# Patient Record
Sex: Female | Born: 1995
Health system: Southern US, Community
[De-identification: ages and names within clinical notes are randomized; demographics above are authoritative.]

## PROBLEM LIST (undated history)

## (undated) DIAGNOSIS — F32A Depression, unspecified: Secondary | ICD-10-CM

## (undated) DIAGNOSIS — F329 Major depressive disorder, single episode, unspecified: Secondary | ICD-10-CM

## (undated) DIAGNOSIS — F913 Oppositional defiant disorder: Secondary | ICD-10-CM

## (undated) DIAGNOSIS — F988 Other specified behavioral and emotional disorders with onset usually occurring in childhood and adolescence: Secondary | ICD-10-CM

## (undated) DIAGNOSIS — F909 Attention-deficit hyperactivity disorder, unspecified type: Secondary | ICD-10-CM

## (undated) DIAGNOSIS — F419 Anxiety disorder, unspecified: Secondary | ICD-10-CM

## (undated) HISTORY — DX: Depression, unspecified: F32.A

## (undated) HISTORY — DX: Attention-deficit hyperactivity disorder, unspecified type: F90.9

## (undated) HISTORY — PX: OTHER SURGICAL HISTORY: SHX169

## (undated) HISTORY — DX: Oppositional defiant disorder: F91.3

## (undated) HISTORY — DX: Major depressive disorder, single episode, unspecified: F32.9

---
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
## 2017-01-09 ENCOUNTER — Ambulatory Visit: Payer: BLUE CROSS/BLUE SHIELD | Admitting: Pediatrics

## 2017-01-10 ENCOUNTER — Telehealth: Payer: Self-pay | Admitting: Pediatrics

## 2017-01-10 ENCOUNTER — Ambulatory Visit: Payer: BLUE CROSS/BLUE SHIELD | Admitting: Family

## 2017-01-10 ENCOUNTER — Encounter: Payer: Self-pay | Admitting: Pediatrics

## 2017-01-10 ENCOUNTER — Encounter: Payer: Self-pay | Admitting: Family

## 2017-01-10 ENCOUNTER — Ambulatory Visit (INDEPENDENT_AMBULATORY_CARE_PROVIDER_SITE_OTHER): Payer: 59 | Admitting: Family

## 2017-01-10 VITALS — BP 126/80 | HR 79 | Temp 98.6°F | Ht 66.0 in | Wt 164.2 lb

## 2017-01-10 DIAGNOSIS — B3731 Acute candidiasis of vulva and vagina: Secondary | ICD-10-CM

## 2017-01-10 DIAGNOSIS — B9689 Other specified bacterial agents as the cause of diseases classified elsewhere: Secondary | ICD-10-CM | POA: Diagnosis not present

## 2017-01-10 DIAGNOSIS — B373 Candidiasis of vulva and vagina: Secondary | ICD-10-CM

## 2017-01-10 DIAGNOSIS — R399 Unspecified symptoms and signs involving the genitourinary system: Secondary | ICD-10-CM

## 2017-01-10 DIAGNOSIS — N760 Acute vaginitis: Secondary | ICD-10-CM

## 2017-01-10 DIAGNOSIS — N898 Other specified noninflammatory disorders of vagina: Secondary | ICD-10-CM | POA: Diagnosis not present

## 2017-01-10 LAB — URINALYSIS, COMPLETE
Bilirubin, UA: NEGATIVE
Glucose, UA: NEGATIVE
Ketones, UA: NEGATIVE
Leukocytes, UA: NEGATIVE
NITRITE UA: NEGATIVE
PH UA: 5.5 (ref 5.0–7.5)
Protein, UA: NEGATIVE
RBC, UA: NEGATIVE
Specific Gravity, UA: 1.02 (ref 1.005–1.030)
UUROB: 0.2 mg/dL (ref 0.2–1.0)

## 2017-01-10 LAB — WET PREP FOR TRICH, YEAST, CLUE
CLUE CELL EXAM: POSITIVE — AB
Trichomonas Exam: NEGATIVE
YEAST EXAM: POSITIVE — AB

## 2017-01-10 LAB — MICROSCOPIC EXAMINATION
Epithelial Cells (non renal): >10 /hpf — ABNORMAL HIGH (ref 0–10)
RBC MICROSCOPIC, UA: NONE SEEN /HPF (ref 0–?)

## 2017-01-10 MED ORDER — FLUCONAZOLE 150 MG PO TABS: 150.0000 mg | ORAL_TABLET | ORAL | 0 refills | Status: DC | PRN

## 2017-01-10 MED ORDER — METRONIDAZOLE 500 MG PO TABS: 500.0000 mg | ORAL_TABLET | Freq: Two times a day (BID) | ORAL | 0 refills | Status: DC

## 2017-01-10 NOTE — Progress Notes (Signed)
   Subjective:    Patient ID: Megan Nielsen, female    DOB: 08/16/1996, 21 y.o.   MRN: [id number]  Vaginal Discharge  The patient's primary symptoms include vaginal discharge. The patient's pertinent negatives include no genital itching or genital odor. This is a new problem. The current episode started in the past 7 days. The problem occurs intermittently. The problem has been unchanged. Associated symptoms include discolored urine, dysuria, frequency and urgency. Pertinent negatives include no flank pain or painful intercourse. The vaginal discharge was white and yellow. There has been no bleeding. She is sexually active. Her menstrual history has been irregular (pt has nexplanon).      Review of Systems  Genitourinary: Positive for dysuria, frequency, urgency and vaginal discharge. Negative for flank pain.  All other systems reviewed and are negative.      Objective:   Physical Exam  Constitutional: She is oriented to person, place, and time. She appears well-developed and well-nourished. No distress.  HENT:  Head: Normocephalic.  Eyes: Pupils are equal, round, and reactive to light.  Neck: Normal range of motion. Neck supple. No thyromegaly present.  Cardiovascular: Normal rate, regular rhythm, normal heart sounds and intact distal pulses.   No murmur heard. Pulmonary/Chest: Effort normal and breath sounds normal. No respiratory distress. She has no wheezes.  Abdominal: Soft. Bowel sounds are normal. She exhibits no distension. There is tenderness (mild lower abd).  Musculoskeletal: Normal range of motion. She exhibits no edema or tenderness.  Neurological: She is alert and oriented to person, place, and time.  Skin: Skin is warm and dry.  Psychiatric: She has a normal mood and affect. Her behavior is normal. Judgment and thought content normal.  Vitals reviewed.     BP 126/80   Pulse 79   Temp 98.6 F (37 C) (Oral)   Ht 5\' 6"  (1.676 m)   Wt 164 lb 3.2 oz (74.5 kg)   BMI  26.50 kg/m      Assessment & Plan:  1. Vaginal discharge - WET PREP FOR [place], YEAST, CLUE  2. UTI symptoms - Urinalysis, Complete  3. BV (bacterial vaginosis) -Keep clean and dry Start probiotic  - metroNIDAZOLE (FLAGYL) 500 MG tablet; Take 1 tablet (500 mg total) by mouth 2 (two) times daily.  Dispense: 14 tablet; Refill: 0  4. Vagina, candidiasis Keep clean and dry Cotton  Underwear Do not scratch - fluconazole (DIFLUCAN) 150 MG tablet; Take 1 tablet (150 mg total) by mouth every three (3) days as needed.  Dispense: 3 tablet; Refill: 0   Evelina Dun, FNP

## 2017-01-10 NOTE — Patient Instructions (Signed)
Bacterial Vaginosis Bacterial vaginosis is a vaginal infection that occurs when the normal balance of bacteria in the vagina is disrupted. It results from an overgrowth of certain bacteria. This is the most common vaginal infection among women ages 15-44. Because bacterial vaginosis increases your risk for STIs (sexually transmitted infections), getting treated can help reduce your risk for chlamydia, gonorrhea, herpes, and HIV (human immunodeficiency virus). Treatment is also important for preventing complications in pregnant women, because this condition can cause an early (premature) delivery. What are the causes? This condition is caused by an increase in harmful bacteria that are normally present in small amounts in the vagina. However, the reason that the condition develops is not fully understood. What increases the risk? The following factors may make you more likely to develop this condition:  Having a new sexual partner or multiple sexual partners.  Having unprotected sex.  Douching.  Having an intrauterine device (IUD).  Smoking.  Drug and alcohol abuse.  Taking certain antibiotic medicines.  Being pregnant.  You cannot get bacterial vaginosis from toilet seats, bedding, swimming pools, or contact with objects around you. What are the signs or symptoms? Symptoms of this condition include:  Grey or white vaginal discharge. The discharge can also be watery or foamy.  A fish-like odor with discharge, especially after sexual intercourse or during menstruation.  Itching in and around the vagina.  Burning or pain with urination.  Some women with bacterial vaginosis have no signs or symptoms. How is this diagnosed? This condition is diagnosed based on:  Your medical history.  A physical exam of the vagina.  Testing a sample of vaginal fluid under a microscope to look for a large amount of bad bacteria or abnormal cells. Your health care provider may use a cotton swab  or a small wooden spatula to collect the sample.  How is this treated? This condition is treated with antibiotics. These may be given as a pill, a vaginal cream, or a medicine that is put into the vagina (suppository). If the condition comes back after treatment, a second round of antibiotics may be needed. Follow these instructions at home: Medicines  Take over-the-counter and prescription medicines only as told by your health care provider.  Take or use your antibiotic as told by your health care provider. Do not stop taking or using the antibiotic even if you start to feel better. General instructions  If you have a female sexual partner, tell her that you have a vaginal infection. She should see her health care provider and be treated if she has symptoms. If you have a female sexual partner, he does not need treatment.  During treatment: ? Avoid sexual activity until you finish treatment. ? Do not douche. ? Avoid alcohol as directed by your health care provider. ? Avoid breastfeeding as directed by your health care provider.  Drink enough water and fluids to keep your urine clear or pale yellow.  Keep the area around your vagina and rectum clean. ? Wash the area daily with warm water. ? Wipe yourself from front to back after using the toilet.  Keep all follow-up visits as told by your health care provider. This is important. How is this prevented?  Do not douche.  Wash the outside of your vagina with warm water only.  Use protection when having sex. This includes latex condoms and dental dams.  Limit how many sexual partners you have. To help prevent bacterial vaginosis, it is best to have sex with just   one partner (monogamous).  Make sure you and your sexual partner are tested for STIs.  Wear cotton or cotton-lined underwear.  Avoid wearing tight pants and pantyhose, especially during summer.  Limit the amount of alcohol that you drink.  Do not use any products that  contain nicotine or tobacco, such as cigarettes and e-cigarettes. If you need help quitting, ask your health care provider.  Do not use illegal drugs. Where to find more information:  Centers for Disease Control and Prevention: www.cdc.gov/std  American Sexual Health Association (ASHA): www.ashastd.org  U.S. Department of Health and Human Services, Office on Women's Health: www.womenshealth.gov/ or https://www.womenshealth.gov/a-z-topics/bacterial-vaginosis Contact a health care provider if:  Your symptoms do not improve, even after treatment.  You have more discharge or pain when urinating.  You have a fever.  You have pain in your abdomen.  You have pain during sex.  You have vaginal bleeding between periods. Summary  Bacterial vaginosis is a vaginal infection that occurs when the normal balance of bacteria in the vagina is disrupted.  Because bacterial vaginosis increases your risk for STIs (sexually transmitted infections), getting treated can help reduce your risk for chlamydia, gonorrhea, herpes, and HIV (human immunodeficiency virus). Treatment is also important for preventing complications in pregnant women, because the condition can cause an early (premature) delivery.  This condition is treated with antibiotic medicines. These may be given as a pill, a vaginal cream, or a medicine that is put into the vagina (suppository). This information is not intended to replace advice given to you by your health care provider. Make sure you discuss any questions you have with your health care provider. Document Released: 08/15/2005 Document Revised: 04/30/2016 Document Reviewed: 04/30/2016 Elsevier Interactive Patient Education  2017 Elsevier Inc.  

## 2017-01-13 ENCOUNTER — Telehealth: Payer: Self-pay | Admitting: Pediatrics

## 2017-01-13 ENCOUNTER — Other Ambulatory Visit: Payer: Self-pay | Admitting: Family Medicine

## 2017-01-13 MED ORDER — METRONIDAZOLE 0.75 % VA GEL: 1.0000 | Freq: Every day | VAGINAL | 0 refills | Status: AC

## 2017-01-13 NOTE — Telephone Encounter (Signed)
Pt aware.

## 2017-01-13 NOTE — Telephone Encounter (Signed)
Please contact the patient Metrogel vaginal sent to pharmacy to take instead of pills

## 2017-01-13 NOTE — Telephone Encounter (Signed)
Patient called stating that she is having a hard time swallowing medication and would like to know if anything else can be sent in to pharmacy

## 2017-02-15 ENCOUNTER — Ambulatory Visit (INDEPENDENT_AMBULATORY_CARE_PROVIDER_SITE_OTHER): Payer: 59 | Admitting: Family Medicine

## 2017-02-15 ENCOUNTER — Encounter: Payer: Self-pay | Admitting: Family Medicine

## 2017-02-15 VITALS — BP 121/85 | HR 97 | Temp 97.7°F | Ht 66.0 in | Wt 162.0 lb

## 2017-02-15 DIAGNOSIS — S80261A Insect bite (nonvenomous), right knee, initial encounter: Secondary | ICD-10-CM

## 2017-02-15 DIAGNOSIS — W57XXXA Bitten or stung by nonvenomous insect and other nonvenomous arthropods, initial encounter: Secondary | ICD-10-CM | POA: Diagnosis not present

## 2017-02-15 MED ORDER — TRIAMCINOLONE ACETONIDE 0.1 % EX CREA: 1.0000 "application " | TOPICAL_CREAM | Freq: Three times a day (TID) | CUTANEOUS | 0 refills | Status: DC

## 2017-02-15 NOTE — Progress Notes (Signed)
Chief Complaint  Patient presents with  . Insect Bite    pt here today with multiple red areas on legs that appear to be some kind of insect bite.    HPI  Patient presents today for Onset this a.m. when she awoke of red swollen bites scattered over her arms and legs. Primarily at the right forearm and thigh as well as the right knee and then there are a couple down near the ankle of both legs. They weren't there when she went to bed. She woke up and they were there. They're itching and burning. Not getting bigger. She has no fever chills or sweats. She does note that the house where she slept has had bed bugs in the past. However another girl who slept in the sling same bed with her does not have bedbug bites today.  PMH: Smoking status noted ROS: Per HPI  Objective: BP 121/85   Pulse 97   Temp 97.7 F (36.5 C) (Oral)   Ht 5\' 6"  (1.676 m)   Wt 162 lb (73.5 kg)   BMI 26.15 kg/m  Gen: NAD, alert, cooperative with exam HEENT: NCAT, EOMI, PERRL Ext: No edema, warm. There are multiple blanching urticarial wheals measuring from 6 mm to 1.5 cm scattered over the limbs. There are about 6 in a row spaced out over the right forearm. One at the upper right thigh 3 scattered over the anterior surface of the right knee. One each posterolateral from the medial malleolus bilaterally.  Neuro: Alert and oriented, No gross deficits  Assessment and plan:  1. Bedbug bite, initial encounter     Meds ordered this encounter  Medications  . triamcinolone cream (KENALOG) 0.1 %    Sig: Apply 1 application topically 3 (three) times daily. Avoid face and genitalia    Dispense:  45 g    Refill:  0    Monitor for signs of infection. Should redness swelling pain or purulent drainage occur follow-up immediately.  Follow up as needed.  Claretta Fraise, MD

## 2017-02-22 ENCOUNTER — Encounter: Payer: Self-pay | Admitting: Pediatrics

## 2017-02-22 ENCOUNTER — Ambulatory Visit (INDEPENDENT_AMBULATORY_CARE_PROVIDER_SITE_OTHER): Payer: 59 | Admitting: Pediatrics

## 2017-02-22 VITALS — BP 114/78 | HR 69 | Temp 97.1°F | Ht 66.0 in | Wt 161.8 lb

## 2017-02-22 DIAGNOSIS — S80861D Insect bite (nonvenomous), right lower leg, subsequent encounter: Secondary | ICD-10-CM | POA: Diagnosis not present

## 2017-02-22 DIAGNOSIS — W57XXXD Bitten or stung by nonvenomous insect and other nonvenomous arthropods, subsequent encounter: Secondary | ICD-10-CM | POA: Diagnosis not present

## 2017-02-22 MED ORDER — TRIAMCINOLONE ACETONIDE 0.1 % EX CREA: 1.0000 "application " | TOPICAL_CREAM | Freq: Two times a day (BID) | CUTANEOUS | 0 refills | Status: DC

## 2017-02-22 NOTE — Progress Notes (Signed)
  Subjective:   Patient ID: Megan Nielsen, female    DOB: 1996/01/04, 21 y.o.   MRN: [id number] CC: Bed Bugs  HPI: Megan Nielsen is a 21 y.o. female presenting for Bed Bugs  Seen last week for red welts on R leg, diagnosed as bed bug bites, treated with topical steroid  Leg spots improved, never itched or hurt Feeling well otherwise Woke up this morning with similar red bumps/bug bites L upper arm and neck Works in Forensic psychologist at [employer] Has been outside Washed all her sheets last visit No other family members/friends with similar spots Thinks the topical steroid did help The rash doesn't come and go, stays but fades over several days  Relevant past medical, surgical, family and social history reviewed. Allergies and medications reviewed and updated. History  Smoking Status  . Passive Smoke Exposure - Never Smoker  Smokeless Tobacco  . Never Used   ROS: Per HPI   Objective:    BP 114/78   Pulse 69   Temp 97.1 F (36.2 C) (Oral)   Ht 5\' 6"  (1.676 m)   Wt 161 lb 12.8 oz (73.4 kg)   BMI 26.12 kg/m   Wt Readings from Last 3 Encounters:  02/22/17 161 lb 12.8 oz (73.4 kg) (89 %, Z= 1.22)*  02/15/17 162 lb (73.5 kg) (89 %, Z= 1.23)*  01/10/17 164 lb 3.2 oz (74.5 kg) (90 %, Z= 1.29)*   * Growth percentiles are based on CDC 2-20 Years data.    Gen: NAD, alert, cooperative with exam, NCAT EYES: EOMI, no conjunctival injection, or no icterus ENT:   OP without erythema LYMPH: no cervical LAD CV: NRRR, normal S1/S2, no murmur, distal pulses 2+ b/l Resp: CTABL, no wheezes, normal WOB Neuro: Alert and oriented Skin: L upper arm with about 10 1cm or less red raised urticaria Several over R shoulder/upper chest as well Several fading similar spots with central scab R lower leg  Assessment & Plan:  Charon was seen today for bed bugs.  Diagnoses and all orders for this visit:  Bug bite, subsequent encounter Triamcinolone as needed Can try benadryl if needed for itching Return  precautions discussed -     triamcinolone cream (KENALOG) 0.1 %; Apply 1 application topically 2 (two) times daily. Avoid face and genitalia   Follow up plan: Return if symptoms worsen or fail to improve. Assunta Found, MD [place]

## 2017-05-09 ENCOUNTER — Encounter: Payer: Self-pay | Admitting: Nurse Practitioner

## 2017-05-09 ENCOUNTER — Ambulatory Visit (INDEPENDENT_AMBULATORY_CARE_PROVIDER_SITE_OTHER): Payer: 59 | Admitting: Nurse Practitioner

## 2017-05-09 VITALS — BP 114/83 | HR 72 | Temp 97.6°F | Ht 66.0 in | Wt 167.0 lb

## 2017-05-09 DIAGNOSIS — L709 Acne, unspecified: Secondary | ICD-10-CM

## 2017-05-09 MED ORDER — CLINDAMYCIN PHOSPHATE 1 % EX SWAB: 1.0000 "application " | Freq: Two times a day (BID) | CUTANEOUS | 2 refills | Status: DC

## 2017-05-09 NOTE — Patient Instructions (Signed)
Acne  Acne is a skin problem that causes small, red bumps (pimples). Acne happens when the tiny holes in your skin (pores) get blocked. Your pores may become red, sore, and swollen. They may also become infected. Acne is a common skin problem. It is especially common in teenagers. Acne usually goes away over time.  Follow these instructions at home:  Good skin care is the most important thing you can do to treat your acne. Take care of your skin as told by your doctor. You may be told to do these things:  · Wash your skin gently at least two times each day. You should also wash your skin:  ? After you exercise.  ? Before you go to bed.  · Use mild soap.  · Use a water-based skin moisturizer after you wash your skin.  · Use a sunscreen or sunblock with SPF 30 or greater. This is very important if you are using acne medicines.  · Choose cosmetics that will not plug your oil glands (are noncomedogenic).    Medicines  · Take over-the-counter and prescription medicines only as told by your doctor.  · If you were prescribed an antibiotic medicine, apply or take it as told by your doctor. Do not stop using the antibiotic even if your acne improves.  General instructions  · Keep your hair clean and off of your face. Shampoo your hair regularly. If you have oily hair, you may need to wash it every day.  · Avoid leaning your chin or forehead on your hands.  · Avoid wearing tight headbands or hats.  · Avoid picking or squeezing your pimples. That can make your acne worse and cause scarring.  · Keep all follow-up visits as told by your doctor. This is important.  · Shave gently. Only shave when it is necessary.  · Keep a food journal. This can help you to see if any foods are linked with your acne.  Contact a doctor if:  · Your acne is not better after eight weeks.  · Your acne gets worse.  · You have a large area of skin that is red or tender.  · You think that you are having side effects from any acne medicine.  This  information is not intended to replace advice given to you by your health care provider. Make sure you discuss any questions you have with your health care provider.  Document Released: 08/04/2011 Document Revised: 01/21/2016 Document Reviewed: 10/22/2014  Elsevier Interactive Patient Education © 2018 Elsevier Inc.

## 2017-05-09 NOTE — Progress Notes (Signed)
   Subjective:    Patient ID: Megan Nielsen, female    DOB: March 20, 1996, 21 y.o.   MRN: [id number]  HPI Patient in the office with complaints of a rash to the left side of her face that has been present for about 2-3 months.  She states usually this rash will go away within a week, but for the last 2-3 months it has been present.  Patient states it itches some, usually after getting out of the shower.  She has tried over-the-counter cleansers and moisturizers and nothing seems to help.  She notices mostly red bumps on her face with an occasional pustule forming.   Review of Systems  Constitutional: Negative for activity change, appetite change and fatigue.  Skin: Positive for rash (left lower cheek).  Neurological: Negative for dizziness, light-headedness and headaches.  All other systems reviewed and are negative.      Objective:   Physical Exam  Constitutional: She appears well-developed and well-nourished. No distress.  HENT:  Head: Normocephalic.  Eyes: Pupils are equal, round, and reactive to light.  Cardiovascular: Normal rate and regular rhythm.   Pulmonary/Chest: Effort normal and breath sounds normal.  Skin: Skin is warm and dry.  Erythematous maculopapular lesion on left cheek that extend down across chin- some slightly bluish discoloration on some lesions.  Psychiatric: She has a normal mood and affect. Her behavior is normal. Judgment and thought content normal.   BP 114/83   Pulse 72   Temp 97.6 F (36.4 C) (Oral)   Ht 5\' 6"  (1.676 m)   Wt 167 lb (75.8 kg)   BMI 26.95 kg/m     Assessment & Plan:  1. Acne, unspecified acne type Remove make up nightly - clindamycin (CLEOCIN T) 1 % SWAB; Apply 1 application topically 2 (two) times daily.  Dispense: 60 Package; Refill: 2 - Ambulatory referral to Dermatology  [place], [place]

## 2017-05-18 DIAGNOSIS — L709 Acne, unspecified: Secondary | ICD-10-CM | POA: Diagnosis not present

## 2017-05-18 DIAGNOSIS — D229 Melanocytic nevi, unspecified: Secondary | ICD-10-CM | POA: Diagnosis not present

## 2017-06-28 DIAGNOSIS — T50904A Poisoning by unspecified drugs, medicaments and biological substances, undetermined, initial encounter: Secondary | ICD-10-CM | POA: Diagnosis not present

## 2017-06-28 DIAGNOSIS — R51 Headache: Secondary | ICD-10-CM | POA: Diagnosis not present

## 2017-08-18 ENCOUNTER — Ambulatory Visit: Payer: 59 | Admitting: Family Medicine

## 2017-08-18 ENCOUNTER — Encounter: Payer: Self-pay | Admitting: Family Medicine

## 2017-08-18 VITALS — BP 127/82 | HR 90 | Temp 97.5°F | Ht 66.01 in | Wt 161.0 lb

## 2017-08-18 DIAGNOSIS — J019 Acute sinusitis, unspecified: Secondary | ICD-10-CM | POA: Diagnosis not present

## 2017-08-18 MED ORDER — FLUTICASONE PROPIONATE 50 MCG/ACT NA SUSP: 1.0000 | Freq: Two times a day (BID) | NASAL | 6 refills | Status: DC | PRN

## 2017-08-18 NOTE — Progress Notes (Signed)
BP 127/82   Pulse 90   Temp (!) 97.5 F (36.4 C) (Oral)   Ht 5' 6.01" (1.677 m)   Wt 161 lb (73 kg)   BMI 25.98 kg/m    Subjective:    Patient ID: Megan Nielsen, female    DOB: Feb 27, 1996, 20 y.o.   MRN: [id number]  HPI: Megan Nielsen is a 21 y.o. female presenting on 08/18/2017 for Cough and Nasal Congestion   HPI Cough and nasal congestion and sinus congestion Patient comes in complaining of cough and nasal congestion and sinus congestion that is been going on for the past 3 days.  She has been using Mucinex for allergy pill but did not seem to improve it.  She says she does get a lot of drainage and sinus pressure just under her eyes and leading to a cough and sore throat.  She denies any fevers or chills or shortness of breath or wheezing.  Says her boyfriend has a similar illness but he got it after her.  Relevant past medical, surgical, family and social history reviewed and updated as indicated. Interim medical history since our last visit reviewed. Allergies and medications reviewed and updated.  Review of Systems  Constitutional: Negative for chills and fever.  HENT: Positive for congestion, postnasal drip, rhinorrhea, sinus pressure and sore throat. Negative for ear discharge, ear pain and sneezing.   Eyes: Negative for visual disturbance.  Respiratory: Positive for cough. Negative for chest tightness and shortness of breath.   Cardiovascular: Negative for chest pain and leg swelling.  Musculoskeletal: Negative for back pain and gait problem.  Skin: Negative for rash.  Neurological: Negative for light-headedness and headaches.  Psychiatric/Behavioral: Negative for agitation and behavioral problems.  All other systems reviewed and are negative.   Per HPI unless specifically indicated above        Objective:    BP 127/82   Pulse 90   Temp (!) 97.5 F (36.4 C) (Oral)   Ht 5' 6.01" (1.677 m)   Wt 161 lb (73 kg)   BMI 25.98 kg/m   Wt Readings from Last 3  Encounters:  08/18/17 161 lb (73 kg) (88 %, Z= 1.17)*  05/09/17 167 lb (75.8 kg) (91 %, Z= 1.33)*  02/22/17 161 lb 12.8 oz (73.4 kg) (89 %, Z= 1.22)*   * Growth percentiles are based on CDC (Girls, 2-20 Years) data.    Physical Exam  Constitutional: She is oriented to person, place, and time. She appears well-developed and well-nourished. No distress.  HENT:  Right Ear: Tympanic membrane, external ear and ear canal normal.  Left Ear: Tympanic membrane, external ear and ear canal normal.  Nose: Mucosal edema and rhinorrhea present. No epistaxis. Right sinus exhibits maxillary sinus tenderness. Right sinus exhibits no frontal sinus tenderness. Left sinus exhibits maxillary sinus tenderness. Left sinus exhibits no frontal sinus tenderness.  Mouth/Throat: Uvula is midline and mucous membranes are normal. Posterior oropharyngeal edema and posterior oropharyngeal erythema present. No oropharyngeal exudate or tonsillar abscesses.  Eyes: Conjunctivae are normal.  Cardiovascular: Normal rate, regular rhythm, normal heart sounds and intact distal pulses.  No murmur heard. Pulmonary/Chest: Effort normal and breath sounds normal. No respiratory distress. She has no wheezes. She has no rales.  Musculoskeletal: Normal range of motion. She exhibits no edema or tenderness.  Neurological: She is alert and oriented to person, place, and time. Coordination normal.  Skin: Skin is warm and dry. No rash noted. She is not diaphoretic.  Psychiatric: She has  a normal mood and affect. Her behavior is normal.  Nursing note and vitals reviewed.       Assessment & Plan:   Problem List Items Addressed This Visit    None    Visit Diagnoses    Acute rhinosinusitis    -  Primary   Relevant Medications   fluticasone (FLONASE) 50 MCG/ACT nasal spray    Try Flonase Mucinex and an allergy pill and to call back if not improved in 5 days  Follow up plan: Return if symptoms worsen or fail to improve.  Counseling  provided for all of the vaccine components No orders of the defined types were placed in this encounter.   Caryl Pina, MD [facility] 08/18/2017, 5:32 PM

## 2017-09-06 ENCOUNTER — Ambulatory Visit: Payer: 59 | Admitting: Family Medicine

## 2017-09-06 NOTE — Progress Notes (Deleted)
Subjective: CC: vaginal discharge/ spotting PCP: Eustaquio Maize, MD Megan Nielsen is a 22 y.o. female presenting to clinic today for:  1. Vaginal Discharge  Patient reports that discharge started ***.  She notes that discharge appears ***.  She *** vaginal odors.  She denies vaginal pruritis, abnormal vaginal bleeding, dysuria, hematuria, pelvic pain, nausea, vomiting, fevers, dyspareunia, post coital bleeding, sore throat, rashes.   She has used *** with *** relief.   *** history of ***.  She *** sexually active and *** use condoms.  Last unprotected intercourse ***.  Contraception: ***.  No LMP recorded. Patient has had an implant.     ROS: Per HPI  No Known Allergies Past Medical History:  Diagnosis Date  . ADD (attention deficit disorder)   . ADHD (attention deficit hyperactivity disorder)   . Anxiety   . Depression   . Oppositional defiant disorder     Current Outpatient Medications:  .  clindamycin (CLEOCIN T) 1 % SWAB, Apply 1 application topically 2 (two) times daily., Disp: 60 Package, Rfl: 2 .  Etonogestrel (NEXPLANON [place]), Inject into the skin., Disp: , Rfl:  .  fluticasone (FLONASE) 50 MCG/ACT nasal spray, Place 1 spray into both nostrils 2 (two) times daily as needed for allergies or rhinitis., Disp: 16 g, Rfl: 6 .  triamcinolone cream (KENALOG) 0.1 %, Apply 1 application topically 2 (two) times daily. Avoid face and genitalia, Disp: 45 g, Rfl: 0 Social History   Socioeconomic History  . Marital status: Single    Spouse name: Not on file  . Number of children: Not on file  . Years of education: Not on file  . Highest education level: Not on file  Social Needs  . Financial resource strain: Not on file  . Food insecurity - worry: Not on file  . Food insecurity - inability: Not on file  . Transportation needs - medical: Not on file  . Transportation needs - non-medical: Not on file  Occupational History  . Occupation: Ship broker    Comment: [employer] 9th  grade [employer]  Tobacco Use  . Smoking status: Passive Smoke Exposure - Never Smoker  . Smokeless tobacco: Never Used  Substance and Sexual Activity  . Alcohol use: No  . Drug use: No  . Sexual activity: Yes    Partners: Male    Birth control/protection: None  Other Topics Concern  . Not on file  Social History Narrative  . Not on file   Family History  Problem Relation Age of Onset  . Alcohol abuse Other   . Drug abuse Other   . Alcohol abuse Father   . Alcohol abuse Paternal Uncle   . Drug abuse Maternal Uncle   . Anxiety disorder Mother   . Hypertension Mother   . Hypertension Maternal Grandmother   . Hypertension Maternal Grandfather   . ADD / ADHD Neg Hx   . Bipolar disorder Neg Hx   . Dementia Neg Hx   . Depression Neg Hx   . OCD Neg Hx   . Paranoid behavior Neg Hx   . Schizophrenia Neg Hx   . Seizures Neg Hx   . Physical abuse Neg Hx   . Sexual abuse Neg Hx     Objective: Office vital signs reviewed. There were no vitals taken for this visit.  Physical Examination:  General: Awake, alert, *** nourished, No acute distress HEENT: Normal    Neck: No masses palpated. No lymphadenopathy    Ears: Tympanic  membranes intact, normal light reflex, no erythema, no bulging    Eyes: PERRLA, extraocular membranes intact, sclera ***    Nose: nasal turbinates moist, *** nasal discharge    Throat: moist mucus membranes, no erythema, *** tonsillar exudate.  Airway is patent Cardio: regular rate and rhythm, S1S2 heard, no murmurs appreciated Pulm: clear to auscultation bilaterally, no wheezes, rhonchi or rales; normal work of breathing on room air GI: soft, non-tender, non-distended, bowel sounds present x4, no hepatomegaly, no splenomegaly, no masses GU: external vaginal tissue ***, cervix ***, *** punctate lesions on cervix appreciated, *** discharge from cervical os, *** bleeding, *** cervical motion tenderness, *** abdominal/ adnexal masses Extremities: warm, well  perfused, No edema, cyanosis or clubbing; +*** pulses bilaterally MSK: *** gait and *** station Skin: dry; intact; no rashes or lesions Neuro: *** Strength and light touch sensation grossly intact, *** DTRs ***/4  Assessment/ Plan: 22 y.o. female   ***  No orders of the defined types were placed in this encounter.  No orders of the defined types were placed in this encounter.    Janora Norlander, DO [place] [phone]

## 2017-09-07 ENCOUNTER — Ambulatory Visit: Payer: 59 | Admitting: Family Medicine

## 2017-09-07 ENCOUNTER — Encounter: Payer: Self-pay | Admitting: Family Medicine

## 2017-09-07 VITALS — BP 130/85 | HR 81 | Temp 97.7°F | Ht 66.0 in | Wt 165.0 lb

## 2017-09-07 DIAGNOSIS — N939 Abnormal uterine and vaginal bleeding, unspecified: Secondary | ICD-10-CM | POA: Diagnosis not present

## 2017-09-07 DIAGNOSIS — N898 Other specified noninflammatory disorders of vagina: Secondary | ICD-10-CM

## 2017-09-07 DIAGNOSIS — Z114 Encounter for screening for human immunodeficiency virus [HIV]: Secondary | ICD-10-CM | POA: Diagnosis not present

## 2017-09-07 LAB — WET PREP FOR TRICH, YEAST, CLUE
Clue Cell Exam: NEGATIVE
TRICHOMONAS EXAM: NEGATIVE
Yeast Exam: NEGATIVE

## 2017-09-07 NOTE — Patient Instructions (Signed)
You had labs performed today.  You will be contacted with the results of the labs once they are available, usually in the next 3 days for routine lab work.

## 2017-09-07 NOTE — Progress Notes (Signed)
Subjective: CC: Abnormal vaginal discharge/bleeding PCP: Eustaquio Maize, MD Megan Nielsen is a 22 y.o. female presenting to clinic today for:  1. Vaginal Discharge  Patient reports that vaginal spotting started 4 days ago.  She reports that 2 days ago she noticed a chunky, bloody discharge.  She notes that this is never happened to her.  She denies vaginal odors.  She denies vaginal pruritis, dysuria, hematuria, pelvic pain, nausea, vomiting, fevers, dyspareunia, post coital bleeding, sore throat, rashes.   She has used nothing for symptoms.   Denies history of STDs.  She is sexually active and does not use condoms.  Last unprotected intercourse 2 weeks ago.  Contraception: Nexplanon, which was placed June 02, 2016.  No LMP recorded. Patient has had an implant.     ROS: Per HPI  No Known Allergies Past Medical History:  Diagnosis Date  . ADD (attention deficit disorder)   . ADHD (attention deficit hyperactivity disorder)   . Anxiety   . Depression   . Oppositional defiant disorder     Current Outpatient Medications:  .  clindamycin (CLEOCIN T) 1 % SWAB, Apply 1 application topically 2 (two) times daily., Disp: 60 Package, Rfl: 2 .  Etonogestrel (NEXPLANON [place]), Inject into the skin., Disp: , Rfl:  .  fluticasone (FLONASE) 50 MCG/ACT nasal spray, Place 1 spray into both nostrils 2 (two) times daily as needed for allergies or rhinitis., Disp: 16 g, Rfl: 6 .  triamcinolone cream (KENALOG) 0.1 %, Apply 1 application topically 2 (two) times daily. Avoid face and genitalia, Disp: 45 g, Rfl: 0 Social History   Socioeconomic History  . Marital status: Single    Spouse name: Not on file  . Number of children: Not on file  . Years of education: Not on file  . Highest education level: Not on file  Social Needs  . Financial resource strain: Not on file  . Food insecurity - worry: Not on file  . Food insecurity - inability: Not on file  . Transportation needs - medical: Not on  file  . Transportation needs - non-medical: Not on file  Occupational History  . Occupation: Ship broker    Comment: [employer] 9th grade [employer]  Tobacco Use  . Smoking status: Passive Smoke Exposure - Never Smoker  . Smokeless tobacco: Never Used  Substance and Sexual Activity  . Alcohol use: No  . Drug use: No  . Sexual activity: Yes    Partners: Male    Birth control/protection: None  Other Topics Concern  . Not on file  Social History Narrative  . Not on file   Family History  Problem Relation Age of Onset  . Alcohol abuse Other   . Drug abuse Other   . Alcohol abuse Father   . Alcohol abuse Paternal Uncle   . Drug abuse Maternal Uncle   . Anxiety disorder Mother   . Hypertension Mother   . Hypertension Maternal Grandmother   . Hypertension Maternal Grandfather   . ADD / ADHD Neg Hx   . Bipolar disorder Neg Hx   . Dementia Neg Hx   . Depression Neg Hx   . OCD Neg Hx   . Paranoid behavior Neg Hx   . Schizophrenia Neg Hx   . Seizures Neg Hx   . Physical abuse Neg Hx   . Sexual abuse Neg Hx     Objective: Office vital signs reviewed. BP 130/85   Pulse 81   Temp 97.7 F (36.5  C) (Oral)   Ht 5\' 6"  (1.676 m)   Wt 165 lb (74.8 kg)   BMI 26.63 kg/m   Physical Examination:  General: Awake, alert, well nourished, No acute distress GU: external vaginal tissue normal, cervix midline, no punctate lesions on cervix appreciated, moderate mucus discharge from cervical os, scant bleeding, no cervical motion tenderness, no abdominal/ adnexal masses   Assessment/ Plan: 22 y.o. female   1. Abnormal vaginal bleeding May be related to Nexplanon.  Will rule out ectopic pregnancy/unplanned pregnancy with serum hCG.  Will also check for STD. - hCG, serum, qualitative  2. Vaginal discharge Again, related to abnormal vaginal bleeding.  Will check for STDs. - WET PREP FOR TRICH, YEAST, CLUE - GC/Chlamydia Probe Amp  3. Screening for HIV without presence of risk factors -  HIV antibody   Orders Placed This Encounter  Procedures  . WET PREP FOR [place], YEAST, CLUE  . GC/Chlamydia Probe Amp  . HIV antibody  . hCG, serum, qualitative    Ashly Windell [place], [place] [phone]

## 2017-09-08 LAB — HIV ANTIBODY (ROUTINE TESTING W REFLEX): HIV Screen 4th Generation wRfx: NONREACTIVE

## 2017-09-08 LAB — HCG, SERUM, QUALITATIVE: HCG, BETA SUBUNIT, QUAL, SERUM: NEGATIVE m[IU]/mL (ref <6)

## 2017-09-09 LAB — GC/CHLAMYDIA PROBE AMP
CHLAMYDIA, DNA PROBE: NEGATIVE
Neisseria gonorrhoeae by PCR: NEGATIVE

## 2017-09-12 ENCOUNTER — Telehealth: Payer: Self-pay | Admitting: Pediatrics

## 2017-09-13 NOTE — Telephone Encounter (Signed)
Labs were reviewed and released to MyChart.

## 2017-09-13 NOTE — Telephone Encounter (Signed)
Patient states that Dr. Darnell Level told her she was going to call her Monday to go over the test results. Went over results with patient.

## 2017-09-15 ENCOUNTER — Telehealth: Payer: Self-pay | Admitting: Pediatrics

## 2017-09-15 NOTE — Telephone Encounter (Signed)
Pt notified of results Verbalizes understanding 

## 2017-11-13 ENCOUNTER — Telehealth: Payer: Self-pay | Admitting: Pediatrics

## 2017-11-13 NOTE — Telephone Encounter (Signed)
Pt given appt for 3/27 at 4 with Dr Evette Doffing.

## 2017-11-22 ENCOUNTER — Ambulatory Visit: Payer: 59 | Admitting: Family Medicine

## 2017-11-22 ENCOUNTER — Encounter: Payer: Self-pay | Admitting: Family Medicine

## 2017-11-22 ENCOUNTER — Ambulatory Visit: Payer: 59 | Admitting: Pediatrics

## 2017-11-22 VITALS — BP 120/79 | HR 80 | Temp 97.3°F | Ht 66.0 in | Wt 175.0 lb

## 2017-11-22 DIAGNOSIS — H9192 Unspecified hearing loss, left ear: Secondary | ICD-10-CM

## 2017-11-22 DIAGNOSIS — H6592 Unspecified nonsuppurative otitis media, left ear: Secondary | ICD-10-CM

## 2017-11-22 MED ORDER — AMOXICILLIN-POT CLAVULANATE 875-125 MG PO TABS: 1.0000 | ORAL_TABLET | Freq: Two times a day (BID) | ORAL | 0 refills | Status: DC

## 2017-11-22 NOTE — Progress Notes (Signed)
Chief Complaint  Patient presents with  . Hearing Problem    left ear    HPI  Patient presents today for left-sided hearing loss.  Patient says she had multiple surgeries on the of her left ear when she was young.  She does not remember why.  She does not have any recollection been told.  However she had a job evaluation a couple months ago and as part of the evaluation had a hearing test.  She did poorly with the left ear and the person doing the test recommended she have follow-up.  She is here today to pursue that.  She is having no pain from either ear.  She has no upper respiratory symptoms.  She does not have a history of frequent infections.  PMH: Smoking status noted ROS: Per HPI  Objective: BP 120/79   Pulse 80   Temp (!) 97.3 F (36.3 C) (Oral)   Ht 5\' 6"  (1.676 m)   Wt 175 lb (79.4 kg)   BMI 28.25 kg/m  Gen: NAD, alert, cooperative with exam HEENT: NCAT, EOMI, PERRL.  The right TM is clear at the left has scarring and a meniscus of fluid from the area of the bottom. CV: RRR, good S1/S2, no murmur Resp: CTABL, no wheezes, non-labored Ext: No edema, warm Neuro: Alert and oriented, No gross deficits  Assessment and plan:  1. Hearing loss of left ear, unspecified hearing loss type   2. Left serous otitis media, unspecified chronicity     No orders of the defined types were placed in this encounter.   No orders of the defined types were placed in this encounter.   Follow up as needed.  Claretta Fraise, MD

## 2018-01-02 DIAGNOSIS — L709 Acne, unspecified: Secondary | ICD-10-CM | POA: Diagnosis not present

## 2018-03-20 ENCOUNTER — Telehealth: Payer: Self-pay | Admitting: Pediatrics

## 2018-03-20 NOTE — Telephone Encounter (Signed)
I spoke to patient on the phone.  She saw me in January for some abnormal vaginal bleeding.  She actually has a Nexplanon in place and is not on OCPs.  The Nexplanon has been in place for about 2 years.  I think that this is probably normal.  We discussed reasons for evaluation in clinic.  Patient was good understanding will follow-up as needed.

## 2018-03-20 NOTE — Telephone Encounter (Signed)
Patient is reporting she has Nexplanon for birth control.  Has not had menstrual cycle in about 2 years, until Saturday when it started.  She is now having some spotting only.  She reports she saw you in January of this year for abnormal bleeding and wanted to let you know what is going on now and what you think and/or recommend.  Please advise.

## 2018-05-15 ENCOUNTER — Telehealth: Payer: Self-pay | Admitting: Pediatrics

## 2018-05-16 ENCOUNTER — Telehealth: Payer: Self-pay | Admitting: Pediatrics

## 2018-05-16 NOTE — Telephone Encounter (Signed)
Patient is wanting to have Nexplanon removed.  Should we schedule this with you or someone else?

## 2018-05-16 NOTE — Telephone Encounter (Signed)
Appointment scheduled.

## 2018-05-16 NOTE — Telephone Encounter (Signed)
I dont believe we put it in here. Schedule with either her gyn or if other provider here takes them out that's OK too.

## 2018-06-07 ENCOUNTER — Encounter: Payer: Self-pay | Admitting: Family Medicine

## 2018-06-07 ENCOUNTER — Ambulatory Visit: Payer: 59 | Admitting: Family Medicine

## 2018-06-07 VITALS — BP 117/80 | HR 86 | Temp 97.5°F | Ht 66.0 in | Wt 189.3 lb

## 2018-06-07 DIAGNOSIS — Z3046 Encounter for surveillance of implantable subdermal contraceptive: Secondary | ICD-10-CM | POA: Diagnosis not present

## 2018-06-07 DIAGNOSIS — Z3009 Encounter for other general counseling and advice on contraception: Secondary | ICD-10-CM

## 2018-06-07 NOTE — Progress Notes (Signed)
BP 117/80   Pulse 86   Temp (!) 97.5 F (36.4 C) (Oral)   Ht 5\' 6"  (1.676 m)   Wt 189 lb 4.8 oz (85.9 kg)   BMI 30.55 kg/m    Subjective:    Patient ID: Megan Nielsen, female    DOB: 1996-06-10, 22 y.o.   MRN: [id number]  HPI: Megan Nielsen is a 22 y.o. female presenting on 06/07/2018 for nexpanon removed (Patient states she got it put in 06/02/2016 in left arm- and is not sure who put it in today. Patient would not like any alternate bc.)   HPI Discussed all forms of birth control patient and that she wants to remove the Nexplanon because she feels like the hormones make her feel funny and she does not want to go on something with hormones, we discussed all options including a ParaGard.  Patient will research and think about them but she just wants this 1 out today.  Her boyfriend is here with her in the room and they both say they will use condoms.  Relevant past medical, surgical, family and social history reviewed and updated as indicated. Interim medical history since our last visit reviewed. Allergies and medications reviewed and updated.  Review of Systems  Constitutional: Negative for chills and fever.  Eyes: Negative for visual disturbance.  Respiratory: Negative for chest tightness and shortness of breath.   Cardiovascular: Negative for chest pain and leg swelling.  Genitourinary: Negative for menstrual problem.  Musculoskeletal: Negative for back pain and gait problem.  Skin: Negative for rash.  Neurological: Negative for light-headedness and headaches.  Psychiatric/Behavioral: Positive for dysphoric mood (She thinks is from birth control). Negative for agitation and behavioral problems.  All other systems reviewed and are negative.   Per HPI unless specifically indicated above   Allergies as of 06/07/2018   No Known Allergies     Medication List        Accurate as of 06/07/18  2:14 PM. Always use your most recent med list.          fluticasone 50  MCG/ACT nasal spray Commonly known as:  FLONASE Place 1 spray into both nostrils 2 (two) times daily as needed for allergies or rhinitis.   NEXPLANON [place] Inject into the skin.          Objective:    BP 117/80   Pulse 86   Temp (!) 97.5 F (36.4 C) (Oral)   Ht 5\' 6"  (1.676 m)   Wt 189 lb 4.8 oz (85.9 kg)   BMI 30.55 kg/m   Wt Readings from Last 3 Encounters:  06/07/18 189 lb 4.8 oz (85.9 kg)  11/22/17 175 lb (79.4 kg) (93 %, Z= 1.49)*  09/07/17 165 lb (74.8 kg) (90 %, Z= 1.27)*   * Growth percentiles are based on CDC (Girls, 2-20 Years) data.    Physical Exam  Constitutional: She appears well-developed and well-nourished. No distress.  Skin:  Palpable Nexplanon in left inner upper arm  Nursing note and vitals reviewed.   Nexplanon removal: Nexplanon is palpable on her left upper inner arm. Site was prepped with Betadine. 43mL of 2% lidocaine without epinephrine were used for anesthesia. 15 blade was used to dissect down to 1 and of the device. Forceps were used to grab the end of the device and extracted which came out intact and whole. Steri-Strips were placed to close the small incision. Pressure dressing was placed over the site to prevent bruising. Patient tolerated procedure  well and bleeding was minimal.     Assessment & Plan:   Problem List Items Addressed This Visit    None    Visit Diagnoses    Nexplanon removal    -  Primary   Birth control counseling       Patient did not want another form of birth control right now, she says she will use condoms, we discussed all forms of birth control and she will research more       Follow up plan: Return if symptoms worsen or fail to improve.  Counseling provided for all of the vaccine components No orders of the defined types were placed in this encounter.   Caryl Pina, MD [facility] 06/07/2018, 2:14 PM

## 2018-06-19 ENCOUNTER — Other Ambulatory Visit: Payer: Self-pay | Admitting: Family Medicine

## 2018-06-19 DIAGNOSIS — J019 Acute sinusitis, unspecified: Secondary | ICD-10-CM

## 2018-06-19 MED ORDER — FLUTICASONE PROPIONATE 50 MCG/ACT NA SUSP: 1.0000 | Freq: Two times a day (BID) | NASAL | 6 refills | Status: DC | PRN

## 2018-06-19 NOTE — Telephone Encounter (Signed)
Refill sent.

## 2019-05-29 ENCOUNTER — Encounter: Payer: Self-pay | Admitting: Family Medicine

## 2019-05-29 ENCOUNTER — Ambulatory Visit (INDEPENDENT_AMBULATORY_CARE_PROVIDER_SITE_OTHER): Payer: 59 | Admitting: Family Medicine

## 2019-05-29 DIAGNOSIS — Z20822 Contact with and (suspected) exposure to covid-19: Secondary | ICD-10-CM

## 2019-05-29 DIAGNOSIS — R6889 Other general symptoms and signs: Secondary | ICD-10-CM

## 2019-05-29 DIAGNOSIS — J069 Acute upper respiratory infection, unspecified: Secondary | ICD-10-CM

## 2019-05-29 NOTE — Progress Notes (Signed)
Virtual Visit via telephone Note Due to COVID-19 pandemic this visit was conducted virtually. This visit type was conducted due to national recommendations for restrictions regarding the COVID-19 Pandemic (e.g. social distancing, sheltering in place) in an effort to limit this patient's exposure and mitigate transmission in our community. All issues noted in this document were discussed and addressed.  A physical exam was not performed with this format.   I connected with Megan Nielsen on 05/29/19 at 1415 by telephone and verified that I am speaking with the correct person using two identifiers. Megan Nielsen is currently located at home and family is currently with them during visit. The provider, Monia Pouch, FNP is located in their office at time of visit.  I discussed the limitations, risks, security and privacy concerns of performing an evaluation and management service by telephone and the availability of in person appointments. I also discussed with the patient that there may be a patient responsible charge related to this service. The patient expressed understanding and agreed to proceed.  Subjective:  Patient ID: Megan Nielsen, female    DOB: [birth year], 23 y.o.   MRN: [id number]  Chief Complaint:  URI   HPI: Megan Nielsen is a 23 y.o. female presenting on 05/29/2019 for URI   Pt states she has cough, congestion, headache, and rhinorrhea. Pt states this started this morning. No fever, chills, weakness, or fatigue. States her father has been exposed to a positive COVID-19 pt and was coughing all day yesterday. She has not tried anything for the symptoms.   URI  This is a new problem. The current episode started today. The problem has been waxing and waning. There has been no fever. Associated symptoms include congestion, coughing, headaches and rhinorrhea. Pertinent negatives include no abdominal pain, chest pain, diarrhea, dysuria, ear pain, joint pain, joint swelling, nausea,  neck pain, plugged ear sensation, rash, sinus pain, sneezing, sore throat, swollen glands, vomiting or wheezing. She has tried nothing for the symptoms.     Relevant past medical, surgical, family, and social history reviewed and updated as indicated.  Allergies and medications reviewed and updated.   Past Medical History:  Diagnosis Date   ADD (attention deficit disorder)    ADHD (attention deficit hyperactivity disorder)    Anxiety    Depression    Oppositional defiant disorder     Past Surgical History:  Procedure Laterality Date   tubes in ears     tubes in ears      Social History   Socioeconomic History   Marital status: Single    Spouse name: Not on file   Number of children: Not on file   Years of education: Not on file   Highest education level: Not on file  Occupational History   Occupation: Ship broker    Comment: [employer] 9th grade [employer]  Social Needs   Financial resource strain: Not on file   Food insecurity    Worry: Not on file    Inability: Not on file   Transportation needs    Medical: Not on file    Non-medical: Not on file  Tobacco Use   Smoking status: Passive Smoke Exposure - Never Smoker   Smokeless tobacco: Never Used  Substance and Sexual Activity   Alcohol use: No   Drug use: No   Sexual activity: Yes    Partners: Male    Birth control/protection: None  Lifestyle   Physical activity    Days per week: Not  on file    Minutes per session: Not on file   Stress: Not on file  Relationships   Social connections    Talks on phone: Not on file    Gets together: Not on file    Attends religious service: Not on file    Active member of club or organization: Not on file    Attends meetings of clubs or organizations: Not on file    Relationship status: Not on file   Intimate partner violence    Fear of current or ex partner: Not on file    Emotionally abused: Not on file    Physically abused: Not on file     Forced sexual activity: Not on file  Other Topics Concern   Not on file  Social History Narrative   Not on file    Outpatient Encounter Medications as of 05/29/2019  Medication Sig   Etonogestrel (NEXPLANON [place]) Inject into the skin.   fluticasone (FLONASE) 50 MCG/ACT nasal spray Place 1 spray into both nostrils 2 (two) times daily as needed for allergies or rhinitis.   No facility-administered encounter medications on file as of 05/29/2019.     No Known Allergies  Review of Systems  Constitutional: Negative for activity change, appetite change, chills, diaphoresis, fatigue, fever and unexpected weight change.  HENT: Positive for congestion, postnasal drip and rhinorrhea. Negative for ear pain, sinus pressure, sinus pain, sneezing, sore throat, tinnitus, trouble swallowing and voice change.   Eyes: Negative.  Negative for photophobia and visual disturbance.  Respiratory: Positive for cough. Negative for chest tightness, shortness of breath and wheezing.   Cardiovascular: Negative for chest pain, palpitations and leg swelling.  Gastrointestinal: Negative for abdominal pain, blood in stool, constipation, diarrhea, nausea and vomiting.  Endocrine: Negative.   Genitourinary: Negative for decreased urine volume, difficulty urinating, dysuria, frequency and urgency.  Musculoskeletal: Negative for arthralgias, joint pain, myalgias and neck pain.  Skin: Negative.  Negative for rash.  Allergic/Immunologic: Negative.   Neurological: Positive for headaches. Negative for dizziness, tremors, seizures, syncope, facial asymmetry, speech difficulty, weakness, light-headedness and numbness.  Hematological: Negative.   Psychiatric/Behavioral: Negative for confusion, hallucinations, sleep disturbance and suicidal ideas.  All other systems reviewed and are negative.        Observations/Objective: No vital signs or physical exam, this was a telephone or virtual health encounter.  Pt alert and  oriented, answers all questions appropriately, and able to speak in full sentences.    Assessment and Plan: Megan Nielsen was seen today for uri.  Diagnoses and all orders for this visit:  URI with cough and congestion  Suspected Covid-19 Virus Infection Reported symptoms concerning for COVID-19. Has had known exposure to COVID-19. Will order testing. Pt aware of testing sites and hours of operation. Symptomatic care and self quarantine measures discussed. Report any new or worsening symptoms. Testing as ordered.  -     Novel Coronavirus, NAA ([facility])     Follow Up Instructions: Return if symptoms worsen or fail to improve.    I discussed the assessment and treatment plan with the patient. The patient was provided an opportunity to ask questions and all were answered. The patient agreed with the plan and demonstrated an understanding of the instructions.   The patient was advised to call back or seek an in-person evaluation if the symptoms worsen or if the condition fails to improve as anticipated.  The above assessment and management plan was discussed with the patient. The patient verbalized understanding of and  has agreed to the management plan. Patient is aware to call the clinic if they develop any new symptoms or if symptoms persist or worsen. Patient is aware when to return to the clinic for a follow-up visit. Patient educated on when it is appropriate to go to the emergency department.    I provided 15 minutes of non-face-to-face time during this encounter. The call started at 1415. The call ended at 1430. The other time was used for coordination of care.    Monia Pouch, FNP-C [facility] [address] [place], [place] [address] [phone] 05/29/19

## 2019-10-01 ENCOUNTER — Encounter: Payer: Self-pay | Admitting: Family Medicine

## 2019-10-01 ENCOUNTER — Ambulatory Visit (INDEPENDENT_AMBULATORY_CARE_PROVIDER_SITE_OTHER): Payer: 59 | Admitting: Family Medicine

## 2019-10-01 DIAGNOSIS — F3162 Bipolar disorder, current episode mixed, moderate: Secondary | ICD-10-CM

## 2019-10-01 DIAGNOSIS — F331 Major depressive disorder, recurrent, moderate: Secondary | ICD-10-CM

## 2019-10-01 MED ORDER — ARIPIPRAZOLE 5 MG PO TABS: 5.0000 mg | ORAL_TABLET | Freq: Every day | ORAL | 1 refills | Status: DC

## 2019-10-01 NOTE — Progress Notes (Signed)
Virtual Visit via telephone Note  I connected with Megan Nielsen on 10/01/19 at 1107 by telephone and verified that I am speaking with the correct person using two identifiers. Megan Nielsen is currently located at home and no other people are currently with her during visit. The provider, Fransisca Kaufmann Penda Venturi, MD is located in their office at time of visit.  Call ended at 1127  I discussed the limitations, risks, security and privacy concerns of performing an evaluation and management service by telephone and the availability of in person appointments. I also discussed with the patient that there may be a patient responsible charge related to this service. The patient expressed understanding and agreed to proceed.   History and Present Illness: Patient is calling in for depression and she has a history of it.  She feels worthless. She denies suicidal ideations. She is tired all of the time and has no energy or motivation.  She has been having this increasingly over the past few months.  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 She gets some times, about 1 time per week she gets manic and then she hits depression.   No diagnosis found.  Outpatient Encounter Medications as of 10/01/2019  Medication Sig  . Etonogestrel (NEXPLANON [place]) Inject into the skin.  . fluticasone (FLONASE) 50 MCG/ACT nasal spray Place 1 spray into both nostrils 2 (two) times daily as needed for allergies or rhinitis.   No facility-administered encounter medications on file as of 10/01/2019.    Review of Systems  Constitutional: Negative for chills and fever.  Respiratory: Negative for chest tightness and shortness of breath.   Cardiovascular: Negative for chest pain and leg swelling.  Musculoskeletal: Negative for back pain and gait problem.  Skin: Negative for rash.  Neurological: Negative for light-headedness and headaches.  Psychiatric/Behavioral: Positive  for decreased concentration, dysphoric mood, sleep disturbance and suicidal ideas. Negative for agitation, behavioral problems and self-injury. The patient is nervous/anxious and is hyperactive.   All other systems reviewed and are negative.   Observations/Objective: Patient sounds comfortable and in no acute distress  Assessment and Plan: Problem List Items Addressed This Visit      Other   Bipolar disorder, current episode mixed, moderate (HCC) - Primary   Relevant Medications   ARIPiprazole (ABILIFY) 5 MG tablet   Depression   Relevant Medications   ARIPiprazole (ABILIFY) 5 MG tablet    Patient has a history of bipolar and insomnia she has some up manic episodes that lasts a day and are probably not full-blown manic based on what she is describing.  We will try Abilify because she does not want to go on birth control and is sexually active and it is relatively safer than other options during possible pregnancy.  She is agreeable with that.  Follow up plan: Return in about 4 weeks (around 10/29/2019), or if symptoms worsen or fail to improve, for depression and pap and physical.     I discussed the assessment and treatment plan with the patient. The patient was provided an opportunity to ask questions and all were answered. The patient agreed with the plan and demonstrated an understanding of the instructions.   The patient was advised to call back or seek an in-person evaluation if the symptoms worsen or if the condition fails to improve as anticipated.  The above assessment and management plan was discussed with the patient. The patient verbalized understanding of  and has agreed to the management plan. Patient is aware to call the clinic if symptoms persist or worsen. Patient is aware when to return to the clinic for a follow-up visit. Patient educated on when it is appropriate to go to the emergency department.    I provided 20 minutes of non-face-to-face time during this  encounter.    Worthy Rancher, MD

## 2019-10-28 ENCOUNTER — Encounter: Payer: Self-pay | Admitting: Family Medicine

## 2019-10-28 ENCOUNTER — Telehealth (INDEPENDENT_AMBULATORY_CARE_PROVIDER_SITE_OTHER): Payer: 59 | Admitting: Family Medicine

## 2019-10-28 DIAGNOSIS — J019 Acute sinusitis, unspecified: Secondary | ICD-10-CM | POA: Diagnosis not present

## 2019-10-28 DIAGNOSIS — B9689 Other specified bacterial agents as the cause of diseases classified elsewhere: Secondary | ICD-10-CM | POA: Diagnosis not present

## 2019-10-28 MED ORDER — BENZONATATE 100 MG PO CAPS: 100.0000 mg | ORAL_CAPSULE | Freq: Three times a day (TID) | ORAL | 0 refills | Status: DC | PRN

## 2019-10-28 MED ORDER — FLUCONAZOLE 150 MG PO TABS: 150.0000 mg | ORAL_TABLET | Freq: Once | ORAL | 0 refills | Status: AC

## 2019-10-28 MED ORDER — AMOXICILLIN-POT CLAVULANATE 875-125 MG PO TABS: 1.0000 | ORAL_TABLET | Freq: Two times a day (BID) | ORAL | 0 refills | Status: DC

## 2019-10-28 NOTE — Progress Notes (Signed)
MyChart Video visit  Subjective: CC: URI PCP: Dettinger, Fransisca Kaufmann, MD [id number] A Megan Nielsen is a 24 y.o. female calls for video consult today. Patient provides verbal consent for consult held via video.  Due to COVID-19 pandemic this visit was conducted virtually. This visit type was conducted due to national recommendations for restrictions regarding the COVID-19 Pandemic (e.g. social distancing, sheltering in place) in an effort to limit this patient's exposure and mitigate transmission in our community. All issues noted in this document were discussed and addressed.  A physical exam was not performed with this format.   Location of patient: home Location of provider: Working remotely from home Others present for call: none  1. URI Patient reports onset of dry cough, sinus drainage that is thick and yellow, sore throat and congestion about 2 weeks ago.  Symptoms are worsening despite use of allergy medications and Mucinex.  She had multiple sick contacts but not sure what they are sick with.  She does admit to shortness of breath and wheezing during coughing spells.  No measured fevers.  She has not been checked for COVID-19.  ROS: Per HPI  No Known Allergies Past Medical History:  Diagnosis Date  . ADD (attention deficit disorder)   . ADHD (attention deficit hyperactivity disorder)   . Anxiety   . Depression   . Oppositional defiant disorder     Current Outpatient Medications:  .  ARIPiprazole (ABILIFY) 5 MG tablet, Take 1 tablet (5 mg total) by mouth daily., Disp: 30 tablet, Rfl: 1 .  fluticasone (FLONASE) 50 MCG/ACT nasal spray, Place 1 spray into both nostrils 2 (two) times daily as needed for allergies or rhinitis., Disp: 16 g, Rfl: 6  Assessment/ Plan: 24 y.o. female   1. Acute bacterial sinusitis Given duration and progression of symptoms 1.  We will treat for acute bacterial sinusitis with Augmentin.  She will take 1 tablet twice daily for 10 days.  Push oral fluids.  Okay  to continue OTC measures if she finds them helpful.  Tessalon Perles sent for cough and Diflucan sent empirically should she develop a yeast infection while on antibiotics.  We discussed reasons for return and reevaluation.  She voiced good understanding will follow as needed - amoxicillin-clavulanate (AUGMENTIN) 875-125 MG tablet; Take 1 tablet by mouth 2 (two) times daily.  Dispense: 20 tablet; Refill: 0 - benzonatate (TESSALON PERLES) 100 MG capsule; Take 1 capsule (100 mg total) by mouth 3 (three) times daily as needed.  Dispense: 20 capsule; Refill: 0 - fluconazole (DIFLUCAN) 150 MG tablet; Take 1 tablet (150 mg total) by mouth once for 1 dose.  Dispense: 1 tablet; Refill: 0   Start time: 2:47pm End time: 2:55pm  Total time spent on patient care (including telephone call/ virtual visit): 20 minutes  [place], [place] [phone]

## 2019-10-28 NOTE — Patient Instructions (Signed)

## 2019-10-30 ENCOUNTER — Ambulatory Visit (INDEPENDENT_AMBULATORY_CARE_PROVIDER_SITE_OTHER): Payer: 59 | Admitting: Family Medicine

## 2019-10-30 ENCOUNTER — Encounter: Payer: Self-pay | Admitting: Family Medicine

## 2019-10-30 DIAGNOSIS — F3162 Bipolar disorder, current episode mixed, moderate: Secondary | ICD-10-CM | POA: Diagnosis not present

## 2019-10-30 DIAGNOSIS — F331 Major depressive disorder, recurrent, moderate: Secondary | ICD-10-CM | POA: Diagnosis not present

## 2019-10-30 MED ORDER — ARIPIPRAZOLE 10 MG PO TABS: 10.0000 mg | ORAL_TABLET | Freq: Every day | ORAL | 1 refills | Status: DC

## 2019-10-30 NOTE — Progress Notes (Signed)
Virtual Visit via telephone Note  I connected with Megan Nielsen on 10/30/19 at 0932 by telephone and verified that I am speaking with the correct person using two identifiers. Megan Nielsen is currently located at home and no other people are currently with her during visit. The provider, Fransisca Kaufmann Raeden Schippers, MD is located in their office at time of visit.  Call ended at [place]  I discussed the limitations, risks, security and privacy concerns of performing an evaluation and management service by telephone and the availability of in person appointments. I also discussed with the patient that there may be a patient responsible charge related to this service. The patient expressed understanding and agreed to proceed.   History and Present Illness: Patient is calling for depression recheck and she was started on Abilify 1 month ago . Feeling like tasks are completing better and less depressed. She feels better but not completely. She denies suicidal ideations or side effects.  She would like to increase the medicine.  No diagnosis found.  Outpatient Encounter Medications as of 10/30/2019  Medication Sig  . amoxicillin-clavulanate (AUGMENTIN) 875-125 MG tablet Take 1 tablet by mouth 2 (two) times daily.  . ARIPiprazole (ABILIFY) 5 MG tablet Take 1 tablet (5 mg total) by mouth daily.  . benzonatate (TESSALON PERLES) 100 MG capsule Take 1 capsule (100 mg total) by mouth 3 (three) times daily as needed.  . fluticasone (FLONASE) 50 MCG/ACT nasal spray Place 1 spray into both nostrils 2 (two) times daily as needed for allergies or rhinitis.   No facility-administered encounter medications on file as of 10/30/2019.    Review of Systems  Constitutional: Negative for chills and fever.  Eyes: Negative for visual disturbance.  Respiratory: Negative for chest tightness and shortness of breath.   Cardiovascular: Negative for chest pain and leg swelling.  Musculoskeletal: Negative for back pain and gait  problem.  Skin: Negative for rash.  Neurological: Negative for light-headedness and headaches.  Psychiatric/Behavioral: Positive for dysphoric mood and sleep disturbance. Negative for agitation, behavioral problems, self-injury and suicidal ideas. The patient is nervous/anxious.   All other systems reviewed and are negative.   Observations/Objective: Patient sounds comfortable and in no acute distress  Assessment and Plan: Problem List Items Addressed This Visit      Other   Bipolar disorder, current episode mixed, moderate (HCC) - Primary   Relevant Medications   ARIPiprazole (ABILIFY) 10 MG tablet   Depression   Relevant Medications   ARIPiprazole (ABILIFY) 10 MG tablet      Will increase Abilify from 5 mg up to 10 mg and see how she is doing, she has her physical in 1 month and we will check up on her then. Follow up plan: Return in about 4 weeks (around 11/27/2019), or if symptoms worsen or fail to improve, for bipolar and depression.     I discussed the assessment and treatment plan with the patient. The patient was provided an opportunity to ask questions and all were answered. The patient agreed with the plan and demonstrated an understanding of the instructions.   The patient was advised to call back or seek an in-person evaluation if the symptoms worsen or if the condition fails to improve as anticipated.  The above assessment and management plan was discussed with the patient. The patient verbalized understanding of and has agreed to the management plan. Patient is aware to call the clinic if symptoms persist or worsen. Patient is aware when to return to  the clinic for a follow-up visit. Patient educated on when it is appropriate to go to the emergency department.    I provided 8 minutes of non-face-to-face time during this encounter.    Worthy Rancher, MD

## 2019-11-25 ENCOUNTER — Ambulatory Visit (INDEPENDENT_AMBULATORY_CARE_PROVIDER_SITE_OTHER): Payer: 59 | Admitting: Family Medicine

## 2019-11-25 ENCOUNTER — Other Ambulatory Visit: Payer: Self-pay

## 2019-11-25 ENCOUNTER — Encounter: Payer: Self-pay | Admitting: Family Medicine

## 2019-11-25 VITALS — BP 118/82 | HR 82 | Temp 99.8°F | Ht 67.0 in | Wt 218.0 lb

## 2019-11-25 DIAGNOSIS — Z01411 Encounter for gynecological examination (general) (routine) with abnormal findings: Secondary | ICD-10-CM | POA: Diagnosis not present

## 2019-11-25 DIAGNOSIS — F331 Major depressive disorder, recurrent, moderate: Secondary | ICD-10-CM | POA: Diagnosis not present

## 2019-11-25 DIAGNOSIS — R45851 Suicidal ideations: Secondary | ICD-10-CM | POA: Diagnosis not present

## 2019-11-25 DIAGNOSIS — F3162 Bipolar disorder, current episode mixed, moderate: Secondary | ICD-10-CM

## 2019-11-25 DIAGNOSIS — Z01419 Encounter for gynecological examination (general) (routine) without abnormal findings: Secondary | ICD-10-CM

## 2019-11-25 MED ORDER — ARIPIPRAZOLE 10 MG PO TABS: 10.0000 mg | ORAL_TABLET | Freq: Every day | ORAL | 1 refills | Status: DC

## 2019-11-25 MED ORDER — PAROXETINE HCL 20 MG PO TABS: 20.0000 mg | ORAL_TABLET | Freq: Every day | ORAL | 1 refills | Status: DC

## 2019-11-25 NOTE — Progress Notes (Signed)
BP 118/82   Pulse 82   Temp 99.8 F (37.7 C)   Ht 5\' 7"  (1.702 m)   Wt 218 lb (98.9 kg)   LMP 11/14/2019 (Approximate)   SpO2 98%   BMI 34.14 kg/m    Subjective:   Patient ID: Megan Nielsen, female    DOB: 03/18/1996, 24 y.o.   MRN: [id number]  HPI: Megan Nielsen is a 24 y.o. female presenting on 11/25/2019 for Annual Exam (with pap) and Manic Behavior (follow up)   HPI Well woman exam and physical Patient is coming in today for well woman exam and physical.  She feels like healthwise she is doing very good.  She is sexually active with one partner that she has been with for 2 years and feels like things have been going well in that regards.  She does not want to discuss birth control.  She does not want a do STD testing besides within the Pap smear.  Bipolar depression Patient is coming in to discuss bipolar depression that is been going on for quite some time.  She is also been diagnosed with PTSD.  We did start the Abilify last time and she feels like the manic episodes are gone and she is not having them any further but she feels like she is depressed all the time and down and has had suicidal ideations although no action plan.  She is thought about cutting herself and she did have one time when she was 17 where she did cut herself and ended up inpatient but she has no plans to do that and she feels like she has a supportive boyfriend.  We gave her suicide crisis line.  Relevant past medical, surgical, family and social history reviewed and updated as indicated. Interim medical history since our last visit reviewed. Allergies and medications reviewed and updated.  Review of Systems  Constitutional: Negative for chills and fever.  HENT: Negative for congestion, ear discharge and ear pain.   Eyes: Negative for redness and visual disturbance.  Respiratory: Negative for chest tightness and shortness of breath.   Cardiovascular: Negative for chest pain and leg swelling.   Gastrointestinal: Negative for abdominal pain.  Genitourinary: Negative for difficulty urinating, dysuria, menstrual problem, vaginal bleeding, vaginal discharge and vaginal pain.  Musculoskeletal: Negative for back pain and gait problem.  Skin: Negative for rash.  Neurological: Negative for light-headedness and headaches.  Psychiatric/Behavioral: Positive for decreased concentration, dysphoric mood and suicidal ideas. Negative for agitation, behavioral problems, self-injury and sleep disturbance. The patient is nervous/anxious.   All other systems reviewed and are negative.   Per HPI unless specifically indicated above   Allergies as of 11/25/2019   No Known Allergies     Medication List       Accurate as of November 25, 2019 10:38 AM. If you have any questions, ask your nurse or doctor.        ARIPiprazole 10 MG tablet Commonly known as: Abilify Take 1 tablet (10 mg total) by mouth daily.   fluticasone 50 MCG/ACT nasal spray Commonly known as: FLONASE Place 1 spray into both nostrils 2 (two) times daily as needed for allergies or rhinitis.   PARoxetine 20 MG tablet Commonly known as: Paxil Take 1 tablet (20 mg total) by mouth daily. Started by: Fransisca Kaufmann Yoel Kaufhold, MD        Objective:   BP 118/82   Pulse 82   Temp 99.8 F (37.7 C)   Ht 5\' 7"  (1.702  m)   Wt 218 lb (98.9 kg)   LMP 11/14/2019 (Approximate)   SpO2 98%   BMI 34.14 kg/m   Wt Readings from Last 3 Encounters:  11/25/19 218 lb (98.9 kg)  06/07/18 189 lb 4.8 oz (85.9 kg)  11/22/17 175 lb (79.4 kg) (93 %, Z= 1.49)*   * Growth percentiles are based on CDC (Girls, 2-20 Years) data.    Physical Exam Vitals and nursing note reviewed. Exam conducted with a chaperone present.  Constitutional:      General: She is not in acute distress.    Appearance: She is well-developed. She is not diaphoretic.  Eyes:     Conjunctiva/sclera: Conjunctivae normal.  Neck:     Thyroid: No thyromegaly.  Cardiovascular:      Rate and Rhythm: Normal rate and regular rhythm.     Heart sounds: Normal heart sounds. No murmur.  Pulmonary:     Effort: Pulmonary effort is normal. No respiratory distress.     Breath sounds: Normal breath sounds. No wheezing.  Chest:     Breasts: Breasts are symmetrical.        Right: No inverted nipple, mass, nipple discharge, skin change or tenderness.        Left: No inverted nipple, mass, nipple discharge, skin change or tenderness.  Abdominal:     General: Bowel sounds are normal. There is no distension.     Palpations: Abdomen is soft.     Tenderness: There is no abdominal tenderness. There is no guarding or rebound.  Genitourinary:    Exam position: Supine.     Labia:        Right: No rash or lesion.        Left: No rash or lesion.      Vagina: Normal.     Cervix: No cervical motion tenderness, discharge or friability.     Uterus: Not deviated, not enlarged, not fixed and not tender.      Adnexa:        Right: No mass or tenderness.         Left: No mass or tenderness.    Musculoskeletal:        General: Normal range of motion.     Cervical back: Neck supple.  Lymphadenopathy:     Cervical: No cervical adenopathy.  Skin:    General: Skin is warm and dry.     Findings: No rash.  Neurological:     Mental Status: She is alert and oriented to person, place, and time.     Coordination: Coordination normal.  Psychiatric:        Mood and Affect: Mood is anxious and depressed.        Behavior: Behavior normal.        Thought Content: Thought content includes suicidal ideation. Thought content does not include suicidal plan.     Assessment & Plan:   Problem List Items Addressed This Visit      Other   Bipolar disorder, current episode mixed, moderate (HCC)   Relevant Medications   PARoxetine (PAXIL) 20 MG tablet   ARIPiprazole (ABILIFY) 10 MG tablet   Suicidal ideation   Relevant Medications   PARoxetine (PAXIL) 20 MG tablet   ARIPiprazole (ABILIFY) 10 MG  tablet   Depression   Relevant Medications   PARoxetine (PAXIL) 20 MG tablet   ARIPiprazole (ABILIFY) 10 MG tablet    Other Visit Diagnoses    Well woman exam with routine gynecological exam    -  Primary   Relevant Orders   IGP, CtNg, rfx Aptima HPV ASCU      We will start an antidepressant Paxil to go along with her Abilify, manic episodes are gone but still depressed. Follow up plan: Return in about 4 weeks (around 12/23/2019), or if symptoms worsen or fail to improve, for Depression and bipolar recheck.  Counseling provided for all of the vaccine components No orders of the defined types were placed in this encounter.   Caryl Pina, MD [facility] 11/25/2019, 10:38 AM

## 2019-11-25 NOTE — Patient Instructions (Signed)
National Suicide Prevention Lifeline Hours: Available 24 hours. Languages: [place], [place]. Learn more [phone]   Mixed Bipolar Disorder Mixed bipolar disorder is a mental health disorder in which a person has episodes of emotional highs (mania), lows (depression), or both of these feelings at the same time. People with this disorder have very big mood changes (mood swings) that happen quickly on a regular basis. These episodes may be severe enough to cause problems with relationships, school, or work. In some cases, they can cause the person to be unsafe, and the person may need to be hospitalized. What are the causes? The cause of this condition is not known. What increases the risk? The following factors may make you more likely to develop this condition:  Having a family history of the disorder.  Abusing substances such as alcohol or drugs.  Having an anxiety disorder.  Having another illness, such as heart disease or thyroid disease. What are the signs or symptoms? Symptoms of this condition include having episodes of mania, depression, and sometimes symptoms of both at the same time. For instance, you may feel sad and full of energy at the same time. You may have mood swings almost every day. Symptoms of mania may include:  Very high self-esteem or self-confidence.  Being unusually talkative, or feeling a need to keep talking. Speech may be very fast. It may seem like you cannot stop talking.  Racing thoughts or constant talking, with quick shifts between topics that may or may not be related (flight of ideas).  Decreased ability to focus or concentrate.  Increased purposeful activity, such as work, study, or social activity.  Increased nonproductive activity. This could be pacing, squirming and fidgeting, or finger and toe tapping.  Impulsive behavior and poor judgment. This may result in high-risk activities, such as having unprotected sex or spending a lot of  money. Symptoms of depression may include:  Feeling sad, hopeless, or helpless.  Lack of feeling or caring about anything.  Not being able to enjoy things that you used to enjoy.  Trouble concentrating or remembering.  Trouble making decisions.  Thoughts of death, or desire to harm yourself. How is this diagnosed? This condition may be diagnosed based on:  Your symptoms and medical history. Your health care provider will ask about your emotional episodes.  A physical exam. This is done to rule out any health problems that may be causing symptoms. Your health care provider will also ask about your alcohol and drug use. How is this treated? Bipolar disorder is a long-term (chronic) illness. It is best controlled with treatment that is given on an ongoing basis, rather than only when symptoms are present. Treatment may include:  Medicine. Medicine can be prescribed by a provider who specializes in treating mental disorders (psychiatrist). ? Medicines called mood stabilizers, antipsychotics, or antidepressants may be prescribed. ? If symptoms occur even while one type of medicine is taken, other medicines may be added.  Psychotherapy. Some forms of talk therapy, such as cognitive behavioral therapy (CBT), can provide support, education, and guidance.  Coping methods, such as journaling or relaxation exercises. These may include: ? Yoga. ? Meditation. ? Deep breathing.  Lifestyle changes, such as: ? Limiting alcohol and drug use. ? Exercising regularly. ? Getting plenty of sleep. ? Making healthy eating choices. A combination of medicine, talk therapy, and coping methods is best. In severe cases, if other treatments do not work, a procedure may be used to change the brain chemicals that send messages  between brain cells (neurotransmitters). This procedure, called electroconvulsive therapy (ECT), applies short electrical pulses to the brain through the scalp. Follow these  instructions at home: Activity   Return to your normal activities as told by your health care provider.  Find activities that you enjoy, and make time to do them.  Exercise regularly as told by your health care provider. Lifestyle  Follow a set schedule for eating and sleeping.  Eat a balanced diet that includes fresh fruits and vegetables, whole grains, low-fat dairy products, and lean meats.  Get 7-8 or more hours of sleep each night.  Do not drink alcohol or use illegal drugs. General instructions  Take over-the-counter and prescription medicines only as told by your health care provider.  Think about joining a support group. Your health care provider may be able to recommend a group for you.  Talk with your family and loved ones about your treatment goals and about how they can help.  Keep all follow-up visits as told by your health care provider. This is important. Contact a health care provider if:  Your symptoms get worse.  You have side effects from your medicine.  You have trouble sleeping.  You have trouble doing daily activities.  You feel unsafe in your surroundings.  You are dealing with substance abuse. Get help right away if:  You think about hurting yourself or you try to hurt yourself.  You think about suicide. If you ever feel like you may hurt yourself or others, or have thoughts about taking your own life, get help right away. You can go to your nearest emergency department or call:  Your local emergency services (911 in the [place]  A suicide crisis helpline, such as the [facility] at (219) 838-8175. This is open 24 hours a day. Summary  Mixed bipolar disorder is a mental health disorder in which a person has episodes of emotional highs (mania), lows (depression), or both of these feelings at the same time.  Bipolar disorder is a long-term (chronic) illness. It is best controlled with treatment that is given on an  ongoing basis, rather than only when symptoms are present.  A combination of medicine, talk therapy, and coping methods is best for treating this condition. This information is not intended to replace advice given to you by your health care provider. Make sure you discuss any questions you have with your health care provider. Document Revised: 07/28/2017 Document Reviewed: 12/09/2016 Elsevier Patient Education  [place].

## 2019-11-26 ENCOUNTER — Other Ambulatory Visit: Payer: Self-pay | Admitting: Family Medicine

## 2019-11-26 DIAGNOSIS — F331 Major depressive disorder, recurrent, moderate: Secondary | ICD-10-CM

## 2019-11-26 DIAGNOSIS — F3162 Bipolar disorder, current episode mixed, moderate: Secondary | ICD-10-CM

## 2019-11-27 ENCOUNTER — Telehealth: Payer: Self-pay | Admitting: Family Medicine

## 2019-11-27 LAB — IGP, CTNG, RFX APTIMA HPV ASCU
Chlamydia, Nuc. Acid Amp: NEGATIVE
Gonococcus by Nucleic Acid Amp: NEGATIVE

## 2019-12-24 ENCOUNTER — Other Ambulatory Visit: Payer: Self-pay

## 2019-12-24 ENCOUNTER — Encounter: Payer: Self-pay | Admitting: Family Medicine

## 2019-12-24 ENCOUNTER — Ambulatory Visit: Payer: 59 | Admitting: Family Medicine

## 2019-12-24 VITALS — BP 116/82 | HR 85 | Temp 96.9°F | Ht 67.0 in | Wt 213.4 lb

## 2019-12-24 DIAGNOSIS — F331 Major depressive disorder, recurrent, moderate: Secondary | ICD-10-CM

## 2019-12-24 DIAGNOSIS — F3162 Bipolar disorder, current episode mixed, moderate: Secondary | ICD-10-CM | POA: Diagnosis not present

## 2019-12-24 DIAGNOSIS — R45851 Suicidal ideations: Secondary | ICD-10-CM | POA: Diagnosis not present

## 2019-12-24 MED ORDER — ARIPIPRAZOLE 15 MG PO TABS: 15.0000 mg | ORAL_TABLET | Freq: Every day | ORAL | 1 refills | Status: DC

## 2019-12-24 MED ORDER — PAROXETINE HCL 40 MG PO TABS: 40.0000 mg | ORAL_TABLET | Freq: Every day | ORAL | 1 refills | Status: DC

## 2019-12-24 NOTE — Progress Notes (Signed)
BP 116/82   Pulse 85   Temp (!) 96.9 F (36.1 C)   Ht 5\' 7"  (1.702 m)   Wt 213 lb 6 oz (96.8 kg)   LMP 12/10/2019 (Exact Date)   SpO2 98%   BMI 33.42 kg/m    Subjective:   Patient ID: Johnell Comings, female    DOB: Feb 17, 1996, 24 y.o.   MRN: [id number]  HPI: ORVELLA DUSHAJ is a 24 y.o. female presenting on 12/24/2019 for Manic Behavior (recheck)   HPI Manic behavior and bipolar Patient is coming in today for bipolar and depression and suicidal ideations, she is feeling down and depressed more.  She feels like she is down and does not feel like the medication is helping at all.  Patient is currently taking Paxil and Abilify.  She does not feel it can be a difference and actually feels more down.  She has not had any manic episodes over the past little bit.  She has no suicidal ideations or passing and has no action plan but has thought about ways to do it just feels like she would be better off dead. PHQ-13, patient tears up in the room.  Relevant past medical, surgical, family and social history reviewed and updated as indicated. Interim medical history since our last visit reviewed. Allergies and medications reviewed and updated.  Review of Systems  Constitutional: Negative for chills and fever.  Eyes: Negative for visual disturbance.  Respiratory: Negative for chest tightness and shortness of breath.   Cardiovascular: Negative for chest pain and leg swelling.  Skin: Negative for rash.  Psychiatric/Behavioral: Positive for decreased concentration, dysphoric mood, sleep disturbance and suicidal ideas. Negative for agitation, behavioral problems and self-injury. The patient is nervous/anxious.   All other systems reviewed and are negative.   Per HPI unless specifically indicated above   Allergies as of 12/24/2019   No Known Allergies     Medication List       Accurate as of December 24, 2019  9:32 AM. If you have any questions, ask your nurse or doctor.         ARIPiprazole 10 MG tablet Commonly known as: Abilify Take 1 tablet (10 mg total) by mouth daily.   fluconazole 150 MG tablet Commonly known as: DIFLUCAN Take 150 mg by mouth daily.   fluticasone 50 MCG/ACT nasal spray Commonly known as: FLONASE Place 1 spray into both nostrils 2 (two) times daily as needed for allergies or rhinitis.   nystatin cream Commonly known as: MYCOSTATIN Apply 1 application topically daily.   PARoxetine 20 MG tablet Commonly known as: Paxil Take 1 tablet (20 mg total) by mouth daily.        Objective:   BP 116/82   Pulse 85   Temp (!) 96.9 F (36.1 C)   Ht 5\' 7"  (1.702 m)   Wt 213 lb 6 oz (96.8 kg)   LMP 12/10/2019 (Exact Date)   SpO2 98%   BMI 33.42 kg/m   Wt Readings from Last 3 Encounters:  12/24/19 213 lb 6 oz (96.8 kg)  11/25/19 218 lb (98.9 kg)  06/07/18 189 lb 4.8 oz (85.9 kg)    Physical Exam Vitals and nursing note reviewed.  Constitutional:      General: She is not in acute distress.    Appearance: She is well-developed. She is not diaphoretic.  Eyes:     Conjunctiva/sclera: Conjunctivae normal.  Musculoskeletal:        General: No tenderness.  Skin:  General: Skin is warm and dry.     Findings: No rash.  Neurological:     Mental Status: She is alert and oriented to person, place, and time.     Coordination: Coordination normal.  Psychiatric:        Mood and Affect: Mood is anxious and depressed.        Behavior: Behavior normal.        Thought Content: Thought content includes suicidal ideation. Thought content does not include suicidal plan.       Assessment & Plan:   Problem List Items Addressed This Visit      Other   Bipolar disorder, current episode mixed, moderate (HCC) - Primary   Relevant Medications   ARIPiprazole (ABILIFY) 15 MG tablet   PARoxetine (PAXIL) 40 MG tablet   Other Relevant Orders   Ambulatory referral to Psychiatry   Suicidal ideation   Relevant Medications   ARIPiprazole  (ABILIFY) 15 MG tablet   PARoxetine (PAXIL) 40 MG tablet   Depression   Relevant Medications   ARIPiprazole (ABILIFY) 15 MG tablet   PARoxetine (PAXIL) 40 MG tablet    Patient has suicidal ideations every day but no thoughts of action plan, will adjust medications, attempted to call multiple different psychiatrist to see if we can get her in somewhere sooner rather than later and we will continue to have staff call.  We will increase both Abilify and Paxil, Abilify up to 15 and Paxil up to 40. Follow up plan: Return in about 3 weeks (around 01/14/2020), or if symptoms worsen or fail to improve, for In person 3-week visit..  Counseling provided for all of the vaccine components Orders Placed This Encounter  Procedures  . Ambulatory referral to Psychiatry    Caryl Pina, MD [facility] 12/24/2019, 9:32 AM

## 2019-12-24 NOTE — Addendum Note (Signed)
Addended by: Caryl Pina on: 12/24/2019 10:07 AM   Modules accepted: Orders

## 2020-01-06 DIAGNOSIS — Z0289 Encounter for other administrative examinations: Secondary | ICD-10-CM

## 2020-10-01 ENCOUNTER — Ambulatory Visit: Payer: 59 | Admitting: Family Medicine

## 2020-10-01 ENCOUNTER — Encounter: Payer: Self-pay | Admitting: Family Medicine

## 2020-10-01 ENCOUNTER — Other Ambulatory Visit: Payer: Self-pay

## 2020-10-01 VITALS — BP 127/85 | HR 80 | Temp 97.5°F | Ht 67.0 in | Wt 222.6 lb

## 2020-10-01 DIAGNOSIS — N926 Irregular menstruation, unspecified: Secondary | ICD-10-CM

## 2020-10-01 DIAGNOSIS — Z113 Encounter for screening for infections with a predominantly sexual mode of transmission: Secondary | ICD-10-CM | POA: Diagnosis not present

## 2020-10-01 NOTE — Addendum Note (Signed)
Addended by: Nigel Berthold C on: 10/01/2020 11:42 AM   Modules accepted: Orders

## 2020-10-01 NOTE — Progress Notes (Signed)
   Assessment & Plan:  1. Irregular menses - STD Screen (8) - NuSwab Vaginitis Plus (VG+)  2. Routine screening for STI (sexually transmitted infection) - STD Screen (8) - NuSwab Vaginitis Plus (VG+)   Follow up plan: Return as scheduled.  Hendricks Limes, MSN, APRN, FNP-C [facility]  Subjective:   Patient ID: Megan Nielsen, female    DOB: Jun 01, 1996, 25 y.o.   MRN: [id number]  HPI: Megan Nielsen is a 25 y.o. female presenting on 10/01/2020 for Menstrual Problem (Patient states that she has been getting her cycle x 2 per month.  Had mens 1/7 & started again 1/25 to present. )  Patient reports she has had her period twice in the month of January.  She states this is never happened before as she is always regular.  She would like to be checked for STIs.  She is not on birth control.  Going forward she is interested in Nexplanon as she reports she had it in the past and had no periods.   ROS: Negative unless specifically indicated above in HPI.   Relevant past medical history reviewed and updated as indicated.   Allergies and medications reviewed and updated.   Current Outpatient Medications:  .  PARoxetine (PAXIL) 40 MG tablet, Take 1 tablet (40 mg total) by mouth daily., Disp: 30 tablet, Rfl: 1  No Known Allergies  Objective:   BP 127/85   Pulse 80   Temp (!) 97.5 F (36.4 C) (Temporal)   Ht 5\' 7"  (1.702 m)   Wt 222 lb 9.6 oz (101 kg)   LMP 09/22/2020 (Approximate)   SpO2 97%   BMI 34.86 kg/m    Physical Exam Vitals reviewed.  Constitutional:      General: She is not in acute distress.    Appearance: Normal appearance. She is obese. She is not ill-appearing, toxic-appearing or diaphoretic.  HENT:     Head: Normocephalic and atraumatic.  Eyes:     General: No scleral icterus.       Right eye: No discharge.        Left eye: No discharge.     Conjunctiva/sclera: Conjunctivae normal.  Cardiovascular:     Rate and Rhythm: Normal rate.   Pulmonary:     Effort: Pulmonary effort is normal. No respiratory distress.  Musculoskeletal:        General: Normal range of motion.     Cervical back: Normal range of motion.  Skin:    General: Skin is warm and dry.     Capillary Refill: Capillary refill takes less than 2 seconds.  Neurological:     General: No focal deficit present.     Mental Status: She is alert and oriented to person, place, and time. Mental status is at baseline.  Psychiatric:        Mood and Affect: Mood normal.        Behavior: Behavior normal.        Thought Content: Thought content normal.        Judgment: Judgment normal.

## 2020-10-02 ENCOUNTER — Telehealth: Payer: Self-pay

## 2020-10-02 LAB — STD SCREEN (8)
HIV Screen 4th Generation wRfx: NONREACTIVE
HSV 1 Glycoprotein G Ab, IgG: <0.91 index (ref 0.00–0.90)
HSV 2 IgG, Type Spec: 17.3 index — ABNORMAL HIGH (ref 0.00–0.90)
Hep A IgM: NEGATIVE
Hep B C IgM: NEGATIVE
Hep C Virus Ab: 0.4 s/co ratio (ref 0.0–0.9)
Hepatitis B Surface Ag: NEGATIVE
RPR Ser Ql: NONREACTIVE

## 2020-10-02 NOTE — Telephone Encounter (Signed)
Provider has not reviewed results at this time. Informed pt that we would call her once she has. Pt understood.

## 2020-10-04 LAB — NUSWAB VAGINITIS PLUS (VG+)
Candida albicans, NAA: POSITIVE — AB
Candida glabrata, NAA: NEGATIVE
Chlamydia trachomatis, NAA: NEGATIVE
Neisseria gonorrhoeae, NAA: NEGATIVE
Trich vag by NAA: NEGATIVE

## 2020-10-05 ENCOUNTER — Other Ambulatory Visit: Payer: Self-pay | Admitting: Family Medicine

## 2020-10-05 DIAGNOSIS — B3731 Acute candidiasis of vulva and vagina: Secondary | ICD-10-CM

## 2020-10-05 DIAGNOSIS — B373 Candidiasis of vulva and vagina: Secondary | ICD-10-CM

## 2020-10-05 MED ORDER — FLUCONAZOLE 150 MG PO TABS: 150.0000 mg | ORAL_TABLET | Freq: Once | ORAL | 0 refills | Status: AC

## 2020-11-30 ENCOUNTER — Encounter: Payer: 59 | Admitting: Family Medicine

## 2021-03-30 ENCOUNTER — Encounter: Payer: Self-pay | Admitting: Nurse Practitioner

## 2021-03-30 ENCOUNTER — Ambulatory Visit: Payer: 59 | Admitting: Nurse Practitioner

## 2021-03-30 VITALS — Temp 101.0°F

## 2021-03-30 DIAGNOSIS — U071 COVID-19: Secondary | ICD-10-CM

## 2021-03-30 MED ORDER — MOLNUPIRAVIR EUA 200MG CAPSULE
4.0000 | ORAL_CAPSULE | Freq: Two times a day (BID) | ORAL | 0 refills | Status: AC
Start: 2021-03-30 — End: 2021-04-04

## 2021-03-30 NOTE — Assessment & Plan Note (Signed)
  Recently tested positive for COVID-19.  Symptoms of shortness of breath, cough, fever 101, diarrhea and sore throat.  Started patient on molnupurivir, education provided to patient.  Rx sent to pharmacy

## 2021-03-30 NOTE — Progress Notes (Signed)
   Virtual Visit  Note Due to COVID-19 pandemic this visit was conducted virtually. This visit type was conducted due to national recommendations for restrictions regarding the COVID-19 Pandemic (e.g. social distancing, sheltering in place) in an effort to limit this patient's exposure and mitigate transmission in our community. All issues noted in this document were discussed and addressed.  A physical exam was not performed with this format.  I connected with [name] on 03/30/21 at 08:30 am by telephone and verified that I am speaking with the correct person using two identifiers. Megan Nielsen is currently located at home during visit. The provider, Ivy Lynn, NP is located in their office at time of visit.  I discussed the limitations, risks, security and privacy concerns of performing an evaluation and management service by telephone and the availability of in person appointments. I also discussed with the patient that there may be a patient responsible charge related to this service. The patient expressed understanding and agreed to proceed.   History and Present Illness:  Cough This is a new problem. The problem has been gradually worsening. The cough is Non-productive. Associated symptoms include chills, a fever and postnasal drip. Exacerbated by: Recent COVID-positive. She has tried nothing for the symptoms.     Review of Systems  Constitutional:  Positive for chills and fever.  HENT:  Positive for postnasal drip.   Respiratory:  Positive for cough.   Gastrointestinal: Negative.   Genitourinary: Negative.   All other systems reviewed and are negative.   Observations/Objective: Televisit patient not in distress  Assessment and Plan:  Recently tested positive for COVID-19.  Symptoms of shortness of breath, cough, fever 101, diarrhea and sore throat.  Started patient on molnupurivir, education provided to patient.  Rx sent to pharmacy.  Follow Up  Instructions: Follow-up with worsening unresolved symptoms    I discussed the assessment and treatment plan with the patient. The patient was provided an opportunity to ask questions and all were answered. The patient agreed with the plan and demonstrated an understanding of the instructions.   The patient was advised to call back or seek an in-person evaluation if the symptoms worsen or if the condition fails to improve as anticipated.  The above assessment and management plan was discussed with the patient. The patient verbalized understanding of and has agreed to the management plan. Patient is aware to call the clinic if symptoms persist or worsen. Patient is aware when to return to the clinic for a follow-up visit. Patient educated on when it is appropriate to go to the emergency department.   Time call ended: 8:40 AM  I provided 10 minutes of  non face-to-face time during this encounter.    Ivy Lynn, NP

## 2021-09-01 ENCOUNTER — Encounter: Payer: Self-pay | Admitting: Family Medicine

## 2021-09-01 ENCOUNTER — Ambulatory Visit: Payer: 59 | Admitting: Family Medicine

## 2021-09-01 VITALS — BP 117/78 | HR 69 | Temp 97.8°F | Ht 67.0 in | Wt 203.8 lb

## 2021-09-01 DIAGNOSIS — J069 Acute upper respiratory infection, unspecified: Secondary | ICD-10-CM | POA: Diagnosis not present

## 2021-09-01 MED ORDER — PROMETHAZINE-DM 6.25-15 MG/5ML PO SYRP
2.5000 mL | ORAL_SOLUTION | Freq: Four times a day (QID) | ORAL | 0 refills | Status: DC | PRN
Start: 2021-09-01 — End: 2021-11-26

## 2021-09-01 NOTE — Patient Instructions (Signed)

## 2021-09-01 NOTE — Progress Notes (Signed)
Subjective: CC: URI PCP: Dettinger, Fransisca Kaufmann, MD [id number] Megan Nielsen is Megan 26 y.o. female presenting to clinic today for:  1.  URI She reports onset of cough, congestion and sore throat the week of Christmas.  Sore throat has resolved but she remains with some residual congestion and what sounds like Megan deep cough when she does cough.  No shortness of breath or wheezing.  She initially was using Mucinex DM but transitioned over to Robitussin-DM which has been Megan little bit more helpful though it has not resolved her symptoms.  No hemoptysis, fevers.   ROS: Per HPI  No Known Allergies Past Medical History:  Diagnosis Date   ADD (attention deficit disorder)    ADHD (attention deficit hyperactivity disorder)    Anxiety    Depression    Oppositional defiant disorder     Current Outpatient Medications:    PARoxetine (PAXIL) 40 MG tablet, Take 1 tablet (40 mg total) by mouth daily., Disp: 30 tablet, Rfl: 1 Social History   Socioeconomic History   Marital status: Single    Spouse name: Not on file   Number of children: Not on file   Years of education: Not on file   Highest education level: Not on file  Occupational History   Occupation: Student    Comment: [employer] 9th grade [employer]  Tobacco Use   Smoking status: Never    Passive exposure: Yes   Smokeless tobacco: Never  Substance and Sexual Activity   Alcohol use: No   Drug use: No   Sexual activity: Yes    Partners: Male    Birth control/protection: None  Other Topics Concern   Not on file  Social History Narrative   Not on file   Social Determinants of Health   Financial Resource Strain: Not on file  Food Insecurity: Not on file  Transportation Needs: Not on file  Physical Activity: Not on file  Stress: Not on file  Social Connections: Not on file  Intimate Partner Violence: Not on file   Family History  Problem Relation Age of Onset   Alcohol abuse Other    Drug abuse Other    Alcohol abuse Father     Alcohol abuse Paternal Uncle    Drug abuse Maternal Uncle    Anxiety disorder Mother    Hypertension Mother    Hypertension Maternal Grandmother    Hypertension Maternal Grandfather    ADD / ADHD Neg Hx    Bipolar disorder Neg Hx    Dementia Neg Hx    Depression Neg Hx    OCD Neg Hx    Paranoid behavior Neg Hx    Schizophrenia Neg Hx    Seizures Neg Hx    Physical abuse Neg Hx    Sexual abuse Neg Hx     Objective: Office vital signs reviewed. BP 117/78    Pulse 69    Temp 97.8 F (36.6 C)    Ht 5\' 7"  (1.702 m)    Wt 203 lb 12.8 oz (92.4 kg)    SpO2 96%    BMI 31.92 kg/m   Physical Examination:  General: Awake, alert, nontoxic-appearing female, No acute distress HEENT: Moist mucous membranes Cardio: regular rate and rhythm, S1S2 heard, no murmurs appreciated Pulm: clear to auscultation bilaterally, no wheezes, rhonchi or rales; normal work of breathing on room air.  No coughing observed during exam  Assessment/ Plan: 26 y.o. female   URI with cough and congestion - Plan: Novel Coronavirus,  NAA ([facility]), promethazine-dextromethorphan (PROMETHAZINE-DM) 6.25-15 MG/5ML syrup  Since cough is harsh and interfering with sleep I have prescribed her Promethazine DM.  Caution sedation.  COVID virus testing also performed for completion.  Discussed red flag signs and symptoms warranting further evaluation and she voiced good understanding.  Follow-up prn  Orders Placed This Encounter  Procedures   Novel Coronavirus, NAA ([facility])    Order Specific Question:   Previously tested for COVID-19    Answer:   Yes    Order Specific Question:   Resident in Megan congregate (group) care setting    Answer:   No    Order Specific Question:   Is the patient student?    Answer:   No    Order Specific Question:   Employed in healthcare setting    Answer:   No    Order Specific Question:   Pregnant    Answer:   No    Order Specific Question:   Has patient completed COVID vaccination(s) (2 doses of  Pfizer/Moderna 1 dose of [employer])    Answer:   No   No orders of the defined types were placed in this encounter.    Janora Norlander, DO [place] [phone]

## 2021-09-02 LAB — NOVEL CORONAVIRUS, NAA: SARS-CoV-2, NAA: NOT DETECTED

## 2021-09-02 LAB — SARS-COV-2, NAA 2 DAY TAT

## 2021-11-26 ENCOUNTER — Encounter: Payer: Self-pay | Admitting: Family Medicine

## 2021-11-26 ENCOUNTER — Ambulatory Visit: Payer: 59 | Admitting: Family Medicine

## 2021-11-26 VITALS — BP 126/78 | HR 88 | Temp 98.2°F | Ht 67.0 in | Wt 228.1 lb

## 2021-11-26 DIAGNOSIS — Z3009 Encounter for other general counseling and advice on contraception: Secondary | ICD-10-CM

## 2021-11-26 DIAGNOSIS — N926 Irregular menstruation, unspecified: Secondary | ICD-10-CM

## 2021-11-26 NOTE — Progress Notes (Signed)
? ?Acute Office Visit ? ?Subjective:  ? ? Patient ID: Megan Nielsen, female    DOB: [birth year], 26 y.o.   MRN: [id number] ? ?Chief Complaint  ?Patient presents with  ? Menstrual Problem  ? ? ?HPI ?Patient is in today for irregular menses. She reports a cycle on 10/30/21 that last for 7 days. She then has spotting until another cycles started yesterday. She reports that her cycles are regular. A chart review shows a previous history of irregular cycles. She denies concerns for STIs. Denies other symptoms. Denies heavy flow. She previously was on Nexplanon with no cycle. ? ? ? ?Past Medical History:  ?Diagnosis Date  ? ADD (attention deficit disorder)   ? ADHD (attention deficit hyperactivity disorder)   ? Anxiety   ? Depression   ? Oppositional defiant disorder   ? ? ?Past Surgical History:  ?Procedure Laterality Date  ? tubes in ears    ? tubes in ears    ? ? ?Family History  ?Problem Relation Age of Onset  ? Alcohol abuse Other   ? Drug abuse Other   ? Alcohol abuse Father   ? Alcohol abuse Paternal Uncle   ? Drug abuse Maternal Uncle   ? Anxiety disorder Mother   ? Hypertension Mother   ? Hypertension Maternal Grandmother   ? Hypertension Maternal Grandfather   ? ADD / ADHD Neg Hx   ? Bipolar disorder Neg Hx   ? Dementia Neg Hx   ? Depression Neg Hx   ? OCD Neg Hx   ? Paranoid behavior Neg Hx   ? Schizophrenia Neg Hx   ? Seizures Neg Hx   ? Physical abuse Neg Hx   ? Sexual abuse Neg Hx   ? ? ?Social History  ? ?Socioeconomic History  ? Marital status: Single  ?  Spouse name: Not on file  ? Number of children: Not on file  ? Years of education: Not on file  ? Highest education level: Not on file  ?Occupational History  ? Occupation: Ship broker  ?  Comment: [employer] 9th grade [employer]  ?Tobacco Use  ? Smoking status: Never  ?  Passive exposure: Yes  ? Smokeless tobacco: Never  ?Substance and Sexual Activity  ? Alcohol use: No  ? Drug use: No  ? Sexual activity: Yes  ?  Partners: Male  ?  Birth control/protection: None   ?Other Topics Concern  ? Not on file  ?Social History Narrative  ? Not on file  ? ?Social Determinants of Health  ? ?Financial Resource Strain: Not on file  ?Food Insecurity: Not on file  ?Transportation Needs: Not on file  ?Physical Activity: Not on file  ?Stress: Not on file  ?Social Connections: Not on file  ?Intimate Partner Violence: Not on file  ? ? ?Outpatient Medications Prior to Visit  ?Medication Sig Dispense Refill  ? PARoxetine (PAXIL) 40 MG tablet Take 1 tablet (40 mg total) by mouth daily. 30 tablet 1  ? promethazine-dextromethorphan (PROMETHAZINE-DM) 6.25-15 MG/5ML syrup Take 2.5 mLs by mouth 4 (four) times daily as needed for cough. 118 mL 0  ? ?No facility-administered medications prior to visit.  ? ? ?No Known Allergies ? ?Review of Systems ?As per HPI.  ?   ?Objective:  ?  ?Physical Exam ?Vitals and nursing note reviewed.  ?Constitutional:   ?   General: She is not in acute distress. ?   Appearance: She is not ill-appearing, toxic-appearing or diaphoretic.  ?Cardiovascular:  ?  Rate and Rhythm: Normal rate and regular rhythm.  ?   Heart sounds: Normal heart sounds. No murmur heard. ?Pulmonary:  ?   Effort: Pulmonary effort is normal. No respiratory distress.  ?   Breath sounds: Normal breath sounds.  ?Musculoskeletal:  ?   Right lower leg: No edema.  ?   Left lower leg: No edema.  ?Skin: ?   General: Skin is warm and dry.  ?Neurological:  ?   General: No focal deficit present.  ?   Mental Status: She is alert and oriented to person, place, and time.  ?Psychiatric:     ?   Mood and Affect: Mood normal.     ?   Behavior: Behavior normal.  ? ? ?BP 126/78   Pulse 88   Temp 98.2 ?F (36.8 ?C) (Temporal)   Ht '5\' 7"'$  (1.702 m)   Wt 228 lb 2 oz (103.5 kg)   BMI 35.73 kg/m?  ?Wt Readings from Last 3 Encounters:  ?11/26/21 228 lb 2 oz (103.5 kg)  ?09/01/21 203 lb 12.8 oz (92.4 kg)  ?10/01/20 222 lb 9.6 oz (101 kg)  ? ? ?Health Maintenance Due  ?Topic Date Due  ? COVID-19 Vaccine (1) Never done  ? HPV  VACCINES (1 - 2-dose series) Never done  ? PAP-Cervical Cytology Screening  Never done  ? TETANUS/TDAP  03/24/2019  ? CHLAMYDIA SCREENING  10/01/2021  ? ? ?   ?Topic Date Due  ? HPV VACCINES (1 - 2-dose series) Never done  ? ? ? ?Lab Results  ?Component Value Date  ? TSH 0.664 10/21/2014  ? ?Lab Results  ?Component Value Date  ? WBC 12.3 (H) 04/11/2016  ? HGB 14.4 04/11/2016  ? HCT 41.9 04/11/2016  ? MCV 90.5 04/11/2016  ? PLT 333 04/11/2016  ? ?Lab Results  ?Component Value Date  ? NA 135 04/11/2016  ? K 3.5 04/11/2016  ? CO2 19 (L) 04/11/2016  ? GLUCOSE 102 (H) 04/11/2016  ? BUN 12 04/11/2016  ? CREATININE 0.87 04/11/2016  ? BILITOT 0.5 04/11/2016  ? ALKPHOS 70 04/11/2016  ? AST 23 04/11/2016  ? ALT 14 04/11/2016  ? PROT 7.5 04/11/2016  ? ALBUMIN 3.9 04/11/2016  ? CALCIUM 8.8 (L) 04/11/2016  ? ANIONGAP 7 04/11/2016  ? ?Lab Results  ?Component Value Date  ? CHOL 140 12/26/2012  ? ?Lab Results  ?Component Value Date  ? HDL 52 12/26/2012  ? ?Lab Results  ?Component Value Date  ? [place] 74 12/26/2012  ? ?Lab Results  ?Component Value Date  ? TRIG 70 12/26/2012  ? ?Lab Results  ?Component Value Date  ? CHOLHDL 2.7 12/26/2012  ? ?Lab Results  ?Component Value Date  ? HGBA1C 5.2 12/26/2012  ? ? ?   ?Assessment & Plan:  ? ?Keshonda was seen today for menstrual problem. ? ?Diagnoses and all orders for this visit: ? ?Irregular menses ?Encounter for counseling regarding contraception ?Discussed hormonal BC as a means to control irregular menses. Discussed nexplanon, IUD, depo-provera, and OCPs as options. She declined options today as she would like to consider her options for now. Declined STI screening today.  ? ? ?Return for schedule CPE with PCP. ? ?The patient indicates understanding of these issues and agrees with the plan. ? ?Gwenlyn Perking, FNP ? ?

## 2021-11-26 NOTE — Patient Instructions (Signed)

## 2021-12-17 ENCOUNTER — Ambulatory Visit (INDEPENDENT_AMBULATORY_CARE_PROVIDER_SITE_OTHER): Payer: 59 | Admitting: Family

## 2021-12-17 ENCOUNTER — Encounter: Payer: Self-pay | Admitting: Family

## 2021-12-17 DIAGNOSIS — B3731 Acute candidiasis of vulva and vagina: Secondary | ICD-10-CM

## 2021-12-17 DIAGNOSIS — N898 Other specified noninflammatory disorders of vagina: Secondary | ICD-10-CM

## 2021-12-17 MED ORDER — FLUCONAZOLE 150 MG PO TABS: 150.0000 mg | ORAL_TABLET | ORAL | 0 refills | Status: DC | PRN

## 2021-12-17 NOTE — Progress Notes (Signed)
? ?Virtual Visit  Note ?Due to COVID-19 pandemic this visit was conducted virtually. This visit type was conducted due to national recommendations for restrictions regarding the COVID-19 Pandemic (e.g. social distancing, sheltering in place) in an effort to limit this patient's exposure and mitigate transmission in our community. All issues noted in this document were discussed and addressed.  A physical exam was not performed with this format. ? ?I connected with Megan Nielsen on 12/17/21 at 12:21 pm  by telephone and verified that I am speaking with the correct person using two identifiers. Megan Nielsen is currently located at home and no one is currently with her during visit. The provider, Evelina Dun, FNP is located in their office at time of visit. ? ?I discussed the limitations, risks, security and privacy concerns of performing an evaluation and management service by telephone and the availability of in person appointments. I also discussed with the patient that there may be a patient responsible charge related to this service. The patient expressed understanding and agreed to proceed. ? ?Megan Nielsen,you are scheduled for a virtual visit with your provider today.   ? ?Just as we do with appointments in the office, we must obtain your consent to participate.  Your consent will be active for this visit and any virtual visit you may have with one of our providers in the next 365 days.   ? ?If you have a MyChart account, I can also send a copy of this consent to you electronically.  All virtual visits are billed to your insurance company just like a traditional visit in the office.  As this is a virtual visit, video technology does not allow for your provider to perform a traditional examination.  This may limit your provider's ability to fully assess your condition.  If your provider identifies any concerns that need to be evaluated in person or the need to arrange testing such as labs, EKG, etc, we will  make arrangements to do so.   ? ?Although advances in technology are sophisticated, we cannot ensure that it will always work on either your end or our end.  If the connection with a video visit is poor, we may have to switch to a telephone visit.  With either a video or telephone visit, we are not always able to ensure that we have a secure connection.   I need to obtain your verbal consent now.   Are you willing to proceed with your visit today?  ? ?Megan Nielsen has provided verbal consent on 12/17/2021 for a virtual visit (video or telephone). ? ? ?Evelina Dun, FNP ?12/17/2021  12:23 PM ? ? ?History and Present Illness: ? ?Vaginal Discharge ?The patient's primary symptoms include vaginal discharge. The patient's pertinent negatives include no genital itching, genital lesions or vaginal bleeding. This is a new problem. The current episode started in the past 7 days. Pertinent negatives include no constipation, diarrhea, dysuria or fever. The vaginal discharge was white and thick. Sexual activity: no new sexual partners. No, her partner does not have an STD.  ? ? ? ?Review of Systems  ?Constitutional:  Negative for fever.  ?Gastrointestinal:  Negative for constipation and diarrhea.  ?Genitourinary:  Positive for vaginal discharge. Negative for dysuria.  ? ? ?Observations/Objective: ?No SOB or distress noted  ? ?Assessment and Plan: ?1. Vaginal discharge ?- fluconazole (DIFLUCAN) 150 MG tablet; Take 1 tablet (150 mg total) by mouth every three (3) days as needed.  Dispense: 2 tablet; Refill:  0 ? ?2. Vagina, candidiasis ?Keep clean and dry ?Probiotic  ?Follow up if symptoms worsen or do not improve  ?- fluconazole (DIFLUCAN) 150 MG tablet; Take 1 tablet (150 mg total) by mouth every three (3) days as needed.  Dispense: 2 tablet; Refill: 0 ? ?I discussed the assessment and treatment plan with the patient. The patient was provided an opportunity to ask questions and all were answered. The patient agreed with the plan  and demonstrated an understanding of the instructions. ?  ?The patient was advised to call back or seek an in-person evaluation if the symptoms worsen or if the condition fails to improve as anticipated. ? ?The above assessment and management plan was discussed with the patient. The patient verbalized understanding of and has agreed to the management plan. Patient is aware to call the clinic if symptoms persist or worsen. Patient is aware when to return to the clinic for a follow-up visit. Patient educated on when it is appropriate to go to the emergency department.  ? ?Time call ended:  12:32 pm ? ?I provided 11 minutes of  non face-to-face time during this encounter. ? ? ? ?Evelina Dun, FNP ? ? ?

## 2022-07-25 ENCOUNTER — Other Ambulatory Visit: Payer: Self-pay | Admitting: Nurse Practitioner

## 2022-07-25 ENCOUNTER — Other Ambulatory Visit (HOSPITAL_COMMUNITY)
Admission: RE | Admit: 2022-07-25 | Discharge: 2022-07-25 | Disposition: A | Discharge status: Home / Self Care | Payer: 59 | Source: Ambulatory Visit | Attending: Nurse Practitioner | Admitting: Nurse Practitioner

## 2022-07-25 DIAGNOSIS — Z124 Encounter for screening for malignant neoplasm of cervix: Secondary | ICD-10-CM | POA: Diagnosis present

## 2022-08-02 LAB — CYTOLOGY - PAP
Comment: NEGATIVE
Comment: NEGATIVE
Comment: NEGATIVE
Diagnosis: HIGH — AB
HPV 16: NEGATIVE
HPV 18 / 45: POSITIVE — AB
High risk HPV: POSITIVE — AB

## 2023-02-14 ENCOUNTER — Encounter: Payer: Self-pay | Admitting: Nurse Practitioner

## 2023-02-14 ENCOUNTER — Ambulatory Visit: Payer: 59 | Admitting: Nurse Practitioner

## 2023-02-14 VITALS — BP 122/88 | HR 81 | Temp 97.6°F | Resp 20 | Ht 67.0 in | Wt 255.0 lb

## 2023-02-14 DIAGNOSIS — H9192 Unspecified hearing loss, left ear: Secondary | ICD-10-CM

## 2023-02-14 NOTE — Progress Notes (Signed)
Subjective:    Patient ID: Megan Nielsen, female    DOB: 09-27-1995, 27 y.o.   MRN: [id number]   Chief Complaint: Concerned about hearing in left ear (Wants referral to ENT)   Pt comes in today requesting referral for ENT; states she has never been able to hear well out of her L ear and she feels like it is getting worse; denies any ear pain, tinnitus, drainage.    Patient Active Problem List   Diagnosis Date Noted   Real time reverse transcriptase PCR positive for COVID-19 virus 03/30/2021   Insomnia due to mental disorder 01/17/2013   Suicidal ideation 12/25/2012   Depression 12/25/2012   PTSD (post-traumatic stress disorder) 12/25/2012   ADHD (attention deficit hyperactivity disorder), inattentive type 12/25/2012   ADHD (attention deficit hyperactivity disorder), combined type 08/03/2011   Bipolar disorder, current episode mixed, moderate (HCC) 08/03/2011   ODD (oppositional defiant disorder) 08/03/2011       Review of Systems  Constitutional:  Negative for fever.  HENT:  Positive for hearing loss. Negative for congestion, ear discharge, ear pain, sinus pressure and sinus pain.   Respiratory:  Negative for shortness of breath.   Cardiovascular:  Negative for chest pain.  Neurological:  Negative for dizziness, light-headedness and headaches.  All other systems reviewed and are negative.      Objective:   Physical Exam Vitals and nursing note reviewed.  Constitutional:      General: She is not in acute distress.    Appearance: Normal appearance. She is not ill-appearing.  HENT:     Head: Normocephalic and atraumatic.     Right Ear: Tympanic membrane, ear canal and external ear normal. There is no impacted cerumen.     Left Ear: External ear normal. There is no impacted cerumen. Tympanic membrane is scarred.     Ears:      Comments: Small scar noted to L TM; otherwise normal    Nose: Nose normal.     Mouth/Throat:     Mouth: Mucous membranes are moist.      Pharynx: Oropharynx is clear.  Eyes:     Conjunctiva/sclera: Conjunctivae normal.     Pupils: Pupils are equal, round, and reactive to light.  Cardiovascular:     Rate and Rhythm: Normal rate and regular rhythm.     Pulses: Normal pulses.     Heart sounds: Normal heart sounds.  Pulmonary:     Effort: Pulmonary effort is normal. No respiratory distress.     Breath sounds: Normal breath sounds. No wheezing, rhonchi or rales.  Lymphadenopathy:     Cervical: No cervical adenopathy.  Skin:    General: Skin is warm and dry.     Capillary Refill: Capillary refill takes less than 2 seconds.  Neurological:     General: No focal deficit present.     Mental Status: She is alert and oriented to person, place, and time.  Psychiatric:        Mood and Affect: Mood normal.        Behavior: Behavior normal.        BP 122/88   Pulse 81   Temp 97.6 F (36.4 C) (Temporal)   Resp 20   Ht 5\' 7"  (1.702 m)   Wt 255 lb (115.7 kg)   SpO2 97%   BMI 39.94 kg/m      Assessment & Plan:  Megan Nielsen in today with chief complaint of Concerned about hearing in left ear (Wants referral  to ENT)   1. Hearing loss of left ear, unspecified hearing loss type Referral to audiology - Ambulatory referral to Audiology    The above assessment and management plan was discussed with the patient. The patient verbalized understanding of and has agreed to the management plan. Patient is aware to call the clinic if symptoms persist or worsen. Patient is aware when to return to the clinic for a follow-up visit. Patient educated on when it is appropriate to go to the emergency department.   Mary-Margaret Daphine Deutscher, FNP

## 2023-04-19 ENCOUNTER — Other Ambulatory Visit: Payer: Self-pay | Admitting: Otolaryngology

## 2023-04-19 DIAGNOSIS — H9012 Conductive hearing loss, unilateral, left ear, with unrestricted hearing on the contralateral side: Secondary | ICD-10-CM

## 2023-05-08 ENCOUNTER — Ambulatory Visit
Admission: RE | Admit: 2023-05-08 | Discharge: 2023-05-08 | Disposition: A | Discharge status: Home / Self Care | Payer: 59 | Source: Ambulatory Visit | Attending: Otolaryngology | Admitting: Otolaryngology

## 2023-05-08 DIAGNOSIS — H9012 Conductive hearing loss, unilateral, left ear, with unrestricted hearing on the contralateral side: Secondary | ICD-10-CM

## 2023-06-12 ENCOUNTER — Telehealth: Payer: Self-pay | Admitting: Family Medicine

## 2023-06-13 ENCOUNTER — Other Ambulatory Visit: Payer: Self-pay

## 2023-06-13 DIAGNOSIS — Z3009 Encounter for other general counseling and advice on contraception: Secondary | ICD-10-CM

## 2023-06-13 NOTE — Telephone Encounter (Signed)
Pt had Nexplanon removed in 2019 by Dr. Louanne Skye.  Pt has used condoms since then but no other form of birth control.  LMP was 9/5. She has not had a cycle since.  Appt made for lab 10/28 for urine pregnancy.  Appt for Nexplanon insertion 10/30 at 3:45pm.  Nursing supervisor made aware that Nexplanon is needed.  Will route encounter to Dettinger to make aware of treatment plan.

## 2023-06-14 NOTE — Telephone Encounter (Signed)
Okay sounds good

## 2023-06-26 ENCOUNTER — Other Ambulatory Visit: Payer: 59

## 2023-06-26 DIAGNOSIS — Z3009 Encounter for other general counseling and advice on contraception: Secondary | ICD-10-CM

## 2023-06-26 LAB — PREGNANCY, URINE: Preg Test, Ur: NEGATIVE

## 2023-06-27 ENCOUNTER — Ambulatory Visit: Payer: 59 | Admitting: Family Medicine

## 2023-06-27 ENCOUNTER — Encounter: Payer: Self-pay | Admitting: Family Medicine

## 2023-06-27 VITALS — BP 117/81 | HR 82 | Ht 67.0 in | Wt 265.0 lb

## 2023-06-27 DIAGNOSIS — Z30017 Encounter for initial prescription of implantable subdermal contraceptive: Secondary | ICD-10-CM

## 2023-06-27 DIAGNOSIS — N926 Irregular menstruation, unspecified: Secondary | ICD-10-CM

## 2023-06-27 MED ORDER — ETONOGESTREL 68 MG ~~LOC~~ IMPL
68.0000 mg | DRUG_IMPLANT | Freq: Once | SUBCUTANEOUS | Status: AC
Start: 2023-06-27 — End: 2023-06-27
  Administered 2023-06-27: 68 mg via SUBCUTANEOUS

## 2023-06-27 NOTE — Progress Notes (Signed)
BP 117/81   Pulse 82   Ht 5\' 7"  (1.702 m)   Wt 265 lb (120.2 kg)   LMP 06/15/2023 (Exact Date)   SpO2 96%   BMI 41.50 kg/m    Subjective:   Patient ID: Megan Nielsen, female    DOB: 15-Aug-1996, 27 y.o.   MRN: [id number]  HPI: Megan Nielsen is a 27 y.o. female presenting on 06/27/2023 for Nexplanon insertion   HPI Nexplanon insertion Patient is coming in today for Nexplanon insertion.  She has had Nexplanon previously and liked it.  She has not had birth control in 4 years.  She is sexually active over the past couple years but does not have regular periods and does not know if she can get pregnant.  Over the past couple months they have been irregular.  She does admit to having increased facial hair and was concerned about possible polycystic ovarian syndrome.  She wants to do some testing for that today.  She is sexually active with 1 female partner for the past 2 years.  Relevant past medical, surgical, family and social history reviewed and updated as indicated. Interim medical history since our last visit reviewed. Allergies and medications reviewed and updated.  Review of Systems  Constitutional:  Negative for chills and fever.  Eyes:  Negative for visual disturbance.  Respiratory:  Negative for chest tightness and shortness of breath.   Cardiovascular:  Negative for chest pain and leg swelling.  Genitourinary:  Negative for difficulty urinating and dysuria.  Musculoskeletal:  Negative for back pain and gait problem.  Skin:  Negative for rash.  Neurological:  Negative for dizziness, light-headedness and headaches.  Psychiatric/Behavioral:  Negative for agitation and behavioral problems.   All other systems reviewed and are negative.   Per HPI unless specifically indicated above   Allergies as of 06/27/2023   No Known Allergies      Medication List    as of June 27, 2023 10:53 AM   You have not been prescribed any medications.      Objective:   BP  117/81   Pulse 82   Ht 5\' 7"  (1.702 m)   Wt 265 lb (120.2 kg)   LMP 06/15/2023 (Exact Date)   SpO2 96%   BMI 41.50 kg/m   Wt Readings from Last 3 Encounters:  06/27/23 265 lb (120.2 kg)  02/14/23 255 lb (115.7 kg)  11/26/21 228 lb 2 oz (103.5 kg)    Physical Exam Vitals and nursing note reviewed.  Constitutional:      General: She is not in acute distress.    Appearance: She is well-developed. She is not diaphoretic.  Eyes:     Conjunctiva/sclera: Conjunctivae normal.  Cardiovascular:     Rate and Rhythm: Normal rate and regular rhythm.     Heart sounds: Normal heart sounds. No murmur heard. Pulmonary:     Effort: Pulmonary effort is normal. No respiratory distress.     Breath sounds: Normal breath sounds. No wheezing.  Musculoskeletal:        General: No swelling. Normal range of motion.  Skin:    General: Skin is warm and dry.     Findings: No rash.  Neurological:     Mental Status: She is alert and oriented to person, place, and time.     Coordination: Coordination normal.  Psychiatric:        Behavior: Behavior normal.     Results for orders placed or performed in visit on 06/26/23  Pregnancy, urine  Result Value Ref Range   Preg Test, Ur Negative Negative    Assessment & Plan:   Problem List Items Addressed This Visit   None Visit Diagnoses     Nexplanon insertion    -  Primary   Relevant Medications   etonogestrel (NEXPLANON) implant 68 mg (Start on 06/27/2023 11:00 AM)   Irregular menses       Relevant Orders   CMP14+EGFR   TSH   Estradiol       Will do blood work for possible PCOS.  She does say that she has to shave her face at least once a week.  She has irregular menstrual cycles. Follow up plan: Return if symptoms worsen or fail to improve.  Counseling provided for all of the vaccine components Orders Placed This Encounter  Procedures   CMP14+EGFR   TSH   Estradiol    Arville Care, MD [facility] 06/27/2023, 10:53 AM

## 2023-06-28 ENCOUNTER — Ambulatory Visit: Payer: 59 | Admitting: Family Medicine

## 2023-06-28 LAB — CMP14+EGFR
ALT: 17 [IU]/L (ref 0–32)
AST: 17 [IU]/L (ref 0–40)
Albumin: 4 g/dL (ref 4.0–5.0)
Alkaline Phosphatase: 108 [IU]/L (ref 44–121)
BUN/Creatinine Ratio: 9 (ref 9–23)
BUN: 8 mg/dL (ref 6–20)
Bilirubin Total: 0.2 mg/dL (ref 0.0–1.2)
CO2: 19 mmol/L — ABNORMAL LOW (ref 20–29)
Calcium: 9.4 mg/dL (ref 8.7–10.2)
Chloride: 107 mmol/L — ABNORMAL HIGH (ref 96–106)
Creatinine, Ser: 0.89 mg/dL (ref 0.57–1.00)
Globulin, Total: 3 g/dL (ref 1.5–4.5)
Glucose: 103 mg/dL — ABNORMAL HIGH (ref 70–99)
Potassium: 4.7 mmol/L (ref 3.5–5.2)
Sodium: 142 mmol/L (ref 134–144)
Total Protein: 7 g/dL (ref 6.0–8.5)
eGFR: 92 mL/min/{1.73_m2} (ref >59)

## 2023-06-28 LAB — TSH: TSH: 1.57 u[IU]/mL (ref 0.450–4.500)

## 2023-06-28 LAB — ESTRADIOL: Estradiol: 26.8 pg/mL

## 2023-12-18 ENCOUNTER — Telehealth: Payer: Self-pay

## 2023-12-18 NOTE — Telephone Encounter (Signed)
Appointment scheduled for 4/22 

## 2023-12-18 NOTE — Telephone Encounter (Signed)
 Copied from CRM [id number]. Topic: Appointments - Scheduling Inquiry for Clinic >> Dec 18, 2023 12:17 PM Tiffany S wrote: Reason for CRM: Patient is trying to get seen soon as possible Dr Dettinger dosen't have anything until May please follow up with patient regarding acute visit

## 2023-12-19 ENCOUNTER — Encounter: Payer: Self-pay | Admitting: Family Medicine

## 2023-12-19 ENCOUNTER — Ambulatory Visit: Admitting: Family Medicine

## 2023-12-19 VITALS — BP 110/76 | HR 71 | Temp 97.9°F | Ht 67.0 in | Wt 262.0 lb

## 2023-12-19 DIAGNOSIS — A084 Viral intestinal infection, unspecified: Secondary | ICD-10-CM | POA: Diagnosis not present

## 2023-12-19 DIAGNOSIS — N898 Other specified noninflammatory disorders of vagina: Secondary | ICD-10-CM

## 2023-12-19 DIAGNOSIS — K219 Gastro-esophageal reflux disease without esophagitis: Secondary | ICD-10-CM | POA: Diagnosis not present

## 2023-12-19 LAB — WET PREP FOR TRICH, YEAST, CLUE
Clue Cell Exam: NEGATIVE
Trichomonas Exam: NEGATIVE
Yeast Exam: NEGATIVE

## 2023-12-19 MED ORDER — FAMOTIDINE 20 MG PO TABS: 20.0000 mg | ORAL_TABLET | Freq: Two times a day (BID) | ORAL | 0 refills | Status: AC | PRN

## 2023-12-19 MED ORDER — ONDANSETRON 4 MG PO TBDP
4.0000 mg | ORAL_TABLET | Freq: Three times a day (TID) | ORAL | 0 refills | Status: AC | PRN
Start: 2023-12-19 — End: ?

## 2023-12-19 NOTE — Progress Notes (Signed)
 Subjective: CC: Diarrhea PCP: Dettinger, Lucio Sabin, MD [id number] A Megan Nielsen is a 28 y.o. female presenting to clinic today for:  1.  Diarrheal illness She reports onset of nonbloody diarrhea on Friday.  She has had some abdominal cramping with associated nausea but no vomiting.  She has been having stools 3 times per day.  There is a bug going around work.  She works at [employer].  She has not skipped any days at work.  She is not utilizing anything except for Tums 4 times a day, which she did prior to becoming ill.  She suffers from quite a bit of GERD.  Has not talked to her PCP about this before  2.  Vaginal odor She reports that since she has had the diarrheal illness she has also been having some vaginal odor.  She does not report any abnormal vaginal bleeding or discharge No LMP recorded. Patient has had an implant.    ROS: Per HPI  No Known Allergies Past Medical History:  Diagnosis Date   ADD (attention deficit disorder)    ADHD (attention deficit hyperactivity disorder)    Anxiety    Depression    Oppositional defiant disorder    No current outpatient medications on file. Social History   Socioeconomic History   Marital status: Single    Spouse name: Not on file   Number of children: Not on file   Years of education: Not on file   Highest education level: Not on file  Occupational History   Occupation: Student    Comment: [employer] 9th grade [employer]  Tobacco Use   Smoking status: Never    Passive exposure: Yes   Smokeless tobacco: Never  Substance and Sexual Activity   Alcohol use: No   Drug use: No   Sexual activity: Yes    Partners: Male    Birth control/protection: None  Other Topics Concern   Not on file  Social History Narrative   Not on file   Social Drivers of Health   Financial Resource Strain: Not on file  Food Insecurity: Low Risk  (04/04/2023)   Received from [facility]   Hunger Vital Sign    Worried About Running Out of Food in the  Last Year: Never true    Ran Out of Food in the Last Year: Never true  Transportation Needs: Not on file (04/04/2023)  Physical Activity: Not on file  Stress: Not on file  Social Connections: Not on file  Intimate Partner Violence: Not on file   Family History  Problem Relation Age of Onset   Alcohol abuse Other    Drug abuse Other    Alcohol abuse Father    Alcohol abuse Paternal Uncle    Drug abuse Maternal Uncle    Anxiety disorder Mother    Hypertension Mother    Hypertension Maternal Grandmother    Hypertension Maternal Grandfather    ADD / ADHD Neg Hx    Bipolar disorder Neg Hx    Dementia Neg Hx    Depression Neg Hx    OCD Neg Hx    Paranoid behavior Neg Hx    Schizophrenia Neg Hx    Seizures Neg Hx    Physical abuse Neg Hx    Sexual abuse Neg Hx     Objective: Office vital signs reviewed. BP 110/76   Pulse 71   Temp 97.9 F (36.6 C)   Ht 5\' 7"  (1.702 m)   Wt 262 lb (118.8 kg)  SpO2 95%   BMI 41.04 kg/m   Physical Examination:  General: Awake, alert, well nourished, No acute distress HEENT:sclera white, MMM Cardio: regular rate and rhythm, S1S2 heard, no murmurs appreciated Pulm: clear to auscultation bilaterally, no wheezes, rhonchi or rales; normal work of breathing on room air GI: soft, mild bilateral lower abdominal discomfort but no rebound, no guarding, non-distended, bowel sounds present x4, no hepatomegaly, no splenomegaly, no masses GU: No suprapubic tenderness to palpation.  Assessment/ Plan: 28 y.o. female   Viral gastroenteritis - Plan: ondansetron  (ZOFRAN -ODT) 4 MG disintegrating tablet  Gastroesophageal reflux disease without esophagitis - Plan: famotidine  (PEPCID ) 20 MG tablet  Vaginal odor - Plan: WET PREP FOR TRICH, YEAST, CLUE  Zofran  sent.  Discussed use of Imodium and risk of prolonging illness but reducing severity of symptoms.  Encourage p.o. hydration.  Red flag signs and symptoms warranting further evaluation discussed.  Work  note provided.  Advised to stay out of work whilst having GI illness so as to prevent foodborne illness passing to customers  For her GERD, avoid excessive use of Tums.  Pepcid  provided for as needed use.  Discussed that if this does not improve or if she starts having any other red flag signs or symptoms she is to see her PCP for further evaluation  Wet prep demonstrated no evidence of infection.  Recommended oral probiotic for pH balance versus use of dietary yogurts.  Follow-up as needed   Eliodoro Guerin, DO [facility] [phone]

## 2023-12-19 NOTE — Patient Instructions (Signed)
 Imodium for diarrhea/ cramping if needed.  GERD in Adults: Diet Changes When you have gastroesophageal reflux disease (GERD), you may need to make changes to your diet. Choosing the right foods can help with your symptoms. Think about working with an expert in healthy eating called a dietitian. They can help you make healthy food choices. What are tips for following this plan? Reading food labels Look for foods that are low in saturated fat. Foods that may help with your symptoms include: Foods with less than 5% of daily value (DV) of fat. Foods with 0 grams of trans fat. Cooking [employer] in ways that don't use a lot of fat. These ways include: Baking. Steaming. Grilling. Broiling. To add flavor, try to use herbs that are low in spice and acidity. Avoid frying your food. Meal planning  Eat small meals often rather than eating 3 large meals each day. Eat your meals slowly in a place where you feel relaxed. If told by your health care provider, avoid: Foods that cause symptoms. Keep a food diary to keep track of foods that cause symptoms. Alcohol. Drinking a lot of liquid with meals. General instructions For 2-3 hours after you eat, avoid: Bending over. Exercise. Lying down. Chew sugar-free gum after meals. What foods should I eat? Eat a healthy diet. Try to include: Foods with high amounts of fiber. These include: Fruits and vegetables. Whole grains and beans. Low-fat dairy products. Lean meats, fish, and poultry. Egg whites. Foods that cause symptoms in someone else may not cause symptoms for you. Work with your provider to find foods that are safe for you. The items listed above may not be all the foods and drinks you can have. Talk with a dietitian to learn more. The items listed above may not be a complete list of foods and beverages you can eat and drink. Contact a dietitian for more information. What foods should I avoid? Limiting some of these foods may help  with your symptoms. Each person is different. Talk with a dietitian or your provider to help you find the exact foods to avoid. Some of the foods to avoid may include: Fruits Fruits with a lot of acid in them. These may include citrus fruits, such as oranges, grapefruit, pineapple, and lemons. Vegetables Deep-fried vegetables, such as [place] fries. Vegetables, sauces, or toppings made with added fat and vegetables with acid in them. These may include tomatoes and tomato products, chili peppers, onions, garlic, and horseradish. Grains Pastries or quick breads with added fat. Meats and other proteins High-fat meats, such as fatty beef or pork, hot dogs, ribs, ham, sausage, salami, and bacon. Fried meat or protein, such as fried fish and fried chicken. Egg yolks. Fats and oils Butter. Margarine. Shortening. Ghee. Drinks Coffee and other drinks with caffeine in them. Fizzy and sugary drinks, such as soda and energy drinks. Fruit juice made with acidic fruits, such as orange or grapefruit. Tomato juice. Sweets and desserts Chocolate and cocoa. Donuts. Seasonings and condiments Mint, such as peppermint and spearmint. Condiments, herbs, or seasonings that cause symptoms. These may include curry, hot sauce, or vinegar-based salad dressings. The items listed above may not be all the foods and drinks you should avoid. Talk with a dietitian to learn more. Questions to ask your health care provider Changes to your diet and everyday life are often the first steps taken to manage symptoms of GERD. If these changes don't help, talk with your provider about taking medicines. Where to find  more information [employer] [facility] [employer]: aboutgerd.org This information is not intended to replace advice given to you by your health care provider. Make sure you discuss any questions you have with your health care provider. Document Revised: 06/27/2023 Document Reviewed:  01/11/2023 Elsevier Patient Education  2024 [employer].
